# Patient Record
Sex: Female | Born: 1945
Health system: Southern US, Community
[De-identification: ages and names within clinical notes are randomized; demographics above are authoritative.]

## PROBLEM LIST (undated history)

## (undated) DIAGNOSIS — E039 Hypothyroidism, unspecified: Secondary | ICD-10-CM

## (undated) DIAGNOSIS — M8580 Other specified disorders of bone density and structure, unspecified site: Secondary | ICD-10-CM

## (undated) DIAGNOSIS — E785 Hyperlipidemia, unspecified: Secondary | ICD-10-CM

## (undated) DIAGNOSIS — Z5189 Encounter for other specified aftercare: Secondary | ICD-10-CM

## (undated) DIAGNOSIS — R011 Cardiac murmur, unspecified: Secondary | ICD-10-CM

## (undated) DIAGNOSIS — C801 Malignant (primary) neoplasm, unspecified: Secondary | ICD-10-CM

## (undated) DIAGNOSIS — D219 Benign neoplasm of connective and other soft tissue, unspecified: Secondary | ICD-10-CM

## (undated) DIAGNOSIS — M25552 Pain in left hip: Secondary | ICD-10-CM

## (undated) DIAGNOSIS — T7840XA Allergy, unspecified, initial encounter: Secondary | ICD-10-CM

## (undated) DIAGNOSIS — Z8249 Family history of ischemic heart disease and other diseases of the circulatory system: Secondary | ICD-10-CM

## (undated) DIAGNOSIS — K635 Polyp of colon: Secondary | ICD-10-CM

## (undated) DIAGNOSIS — M25551 Pain in right hip: Secondary | ICD-10-CM

## (undated) DIAGNOSIS — I10 Essential (primary) hypertension: Secondary | ICD-10-CM

## (undated) DIAGNOSIS — R0600 Dyspnea, unspecified: Secondary | ICD-10-CM

## (undated) DIAGNOSIS — H269 Unspecified cataract: Secondary | ICD-10-CM

## (undated) HISTORY — DX: Hypothyroidism, unspecified: E03.9

## (undated) HISTORY — DX: Family history of ischemic heart disease and other diseases of the circulatory system: Z82.49

## (undated) HISTORY — DX: Hyperlipidemia, unspecified: E78.5

## (undated) HISTORY — DX: Polyp of colon: K63.5

## (undated) HISTORY — PX: BREAST CYST EXCISION: SHX579

## (undated) HISTORY — DX: Benign neoplasm of connective and other soft tissue, unspecified: D21.9

## (undated) HISTORY — DX: Essential (primary) hypertension: I10

## (undated) HISTORY — DX: Other specified disorders of bone density and structure, unspecified site: M85.80

## (undated) HISTORY — DX: Unspecified cataract: H26.9

## (undated) HISTORY — DX: Allergy, unspecified, initial encounter: T78.40XA

## (undated) HISTORY — PX: COLONOSCOPY: SHX174

## (undated) HISTORY — DX: Malignant (primary) neoplasm, unspecified: C80.1

## (undated) HISTORY — DX: Encounter for other specified aftercare: Z51.89

## (undated) HISTORY — DX: Cardiac murmur, unspecified: R01.1

---
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
## 1988-03-23 HISTORY — PX: TUBAL LIGATION: SHX77

## 1996-03-23 HISTORY — PX: LUNG CANCER SURGERY: SHX702

## 1996-03-23 HISTORY — PX: CHOLECYSTECTOMY: SHX55

## 1999-11-10 ENCOUNTER — Encounter: Admission: RE | Admit: 1999-11-10 | Discharge: 1999-11-10 | Payer: Self-pay | Admitting: Hematology & Oncology

## 1999-11-10 ENCOUNTER — Encounter: Payer: Self-pay | Admitting: Hematology & Oncology

## 2000-11-01 ENCOUNTER — Encounter: Admission: RE | Admit: 2000-11-01 | Discharge: 2000-11-01 | Payer: Self-pay | Admitting: Hematology & Oncology

## 2000-11-01 ENCOUNTER — Encounter: Payer: Self-pay | Admitting: Hematology & Oncology

## 2001-05-16 ENCOUNTER — Ambulatory Visit (HOSPITAL_COMMUNITY): Admission: RE | Admit: 2001-05-16 | Discharge: 2001-05-16 | Payer: Self-pay | Admitting: Hematology & Oncology

## 2001-05-16 ENCOUNTER — Encounter: Payer: Self-pay | Admitting: Hematology & Oncology

## 2002-03-23 DIAGNOSIS — K635 Polyp of colon: Secondary | ICD-10-CM

## 2002-03-23 HISTORY — DX: Polyp of colon: K63.5

## 2003-10-23 ENCOUNTER — Other Ambulatory Visit: Admission: RE | Admit: 2003-10-23 | Discharge: 2003-10-23 | Payer: Self-pay | Admitting: Family Medicine

## 2004-05-02 ENCOUNTER — Ambulatory Visit: Payer: Self-pay | Admitting: Hematology & Oncology

## 2005-05-01 ENCOUNTER — Ambulatory Visit: Payer: Self-pay | Admitting: Hematology & Oncology

## 2005-12-04 ENCOUNTER — Ambulatory Visit: Payer: Self-pay | Admitting: Family Medicine

## 2005-12-11 ENCOUNTER — Other Ambulatory Visit: Admission: RE | Admit: 2005-12-11 | Discharge: 2005-12-11 | Payer: Self-pay | Admitting: Family Medicine

## 2005-12-11 ENCOUNTER — Ambulatory Visit: Payer: Self-pay | Admitting: Family Medicine

## 2006-04-28 ENCOUNTER — Ambulatory Visit: Payer: Self-pay | Admitting: Hematology & Oncology

## 2006-05-03 LAB — CBC WITH DIFFERENTIAL/PLATELET
Eosinophils Absolute: 0.2 10*3/uL (ref 0.0–0.5)
MONO#: 0.7 10*3/uL (ref 0.1–0.9)
NEUT#: 2.9 10*3/uL (ref 1.5–6.5)
RBC: 4.68 10*6/uL (ref 3.70–5.32)
RDW: 12.6 % (ref 11.3–14.5)
WBC: 5.8 10*3/uL (ref 3.9–10.0)

## 2006-05-03 LAB — COMPREHENSIVE METABOLIC PANEL
Albumin: 4.7 g/dL (ref 3.5–5.2)
Alkaline Phosphatase: 69 U/L (ref 39–117)
CO2: 23 mEq/L (ref 19–32)
Glucose, Bld: 101 mg/dL — ABNORMAL HIGH (ref 70–99)
Potassium: 4 mEq/L (ref 3.5–5.3)
Sodium: 143 mEq/L (ref 135–145)
Total Protein: 7.4 g/dL (ref 6.0–8.3)

## 2006-05-11 ENCOUNTER — Ambulatory Visit: Payer: Self-pay | Admitting: Family Medicine

## 2007-01-05 ENCOUNTER — Other Ambulatory Visit: Admission: RE | Admit: 2007-01-05 | Discharge: 2007-01-05 | Payer: Self-pay | Admitting: Family Medicine

## 2007-01-05 ENCOUNTER — Ambulatory Visit: Payer: Self-pay | Admitting: Family Medicine

## 2007-04-29 ENCOUNTER — Ambulatory Visit: Payer: Self-pay | Admitting: Hematology & Oncology

## 2007-05-04 LAB — CBC WITH DIFFERENTIAL/PLATELET
Eosinophils Absolute: 0.1 10*3/uL (ref 0.0–0.5)
LYMPH%: 30.8 % (ref 14.0–48.0)
MONO#: 0.7 10*3/uL (ref 0.1–0.9)
NEUT#: 3.7 10*3/uL (ref 1.5–6.5)
Platelets: 242 10*3/uL (ref 145–400)
RBC: 4.72 10*6/uL (ref 3.70–5.32)
RDW: 13.3 % (ref 11.3–14.5)
WBC: 6.6 10*3/uL (ref 3.9–10.0)

## 2007-05-04 LAB — COMPREHENSIVE METABOLIC PANEL
Albumin: 5 g/dL (ref 3.5–5.2)
CO2: 24 mEq/L (ref 19–32)
Glucose, Bld: 100 mg/dL — ABNORMAL HIGH (ref 70–99)
Potassium: 4.4 mEq/L (ref 3.5–5.3)
Sodium: 140 mEq/L (ref 135–145)
Total Protein: 7.6 g/dL (ref 6.0–8.3)

## 2007-08-29 ENCOUNTER — Ambulatory Visit: Payer: Self-pay | Admitting: Internal Medicine

## 2007-09-15 ENCOUNTER — Ambulatory Visit: Payer: Self-pay | Admitting: Internal Medicine

## 2007-09-15 ENCOUNTER — Encounter: Payer: Self-pay | Admitting: Internal Medicine

## 2007-09-16 ENCOUNTER — Encounter: Payer: Self-pay | Admitting: Internal Medicine

## 2008-01-16 ENCOUNTER — Ambulatory Visit: Payer: Self-pay | Admitting: Family Medicine

## 2008-02-03 ENCOUNTER — Ambulatory Visit: Payer: Self-pay | Admitting: Family Medicine

## 2008-02-09 LAB — HM DEXA SCAN

## 2008-03-23 HISTORY — PX: PERICARDIAL WINDOW: SHX2213

## 2008-04-10 ENCOUNTER — Ambulatory Visit: Payer: Self-pay | Admitting: Family Medicine

## 2008-06-07 ENCOUNTER — Ambulatory Visit: Payer: Self-pay | Admitting: Family Medicine

## 2008-09-07 ENCOUNTER — Ambulatory Visit: Payer: Self-pay | Admitting: Family Medicine

## 2008-11-05 ENCOUNTER — Encounter: Admission: RE | Admit: 2008-11-05 | Discharge: 2008-11-05 | Payer: Self-pay | Admitting: Family Medicine

## 2008-11-05 ENCOUNTER — Ambulatory Visit: Payer: Self-pay | Admitting: Family Medicine

## 2008-11-12 ENCOUNTER — Ambulatory Visit: Payer: Self-pay | Admitting: Thoracic Surgery

## 2008-11-19 ENCOUNTER — Inpatient Hospital Stay (HOSPITAL_COMMUNITY): Admission: RE | Admit: 2008-11-19 | Discharge: 2008-11-23 | Payer: Self-pay | Admitting: Thoracic Surgery

## 2008-11-19 ENCOUNTER — Encounter: Payer: Self-pay | Admitting: Thoracic Surgery

## 2008-11-19 ENCOUNTER — Ambulatory Visit: Payer: Self-pay | Admitting: Thoracic Surgery

## 2008-11-28 ENCOUNTER — Encounter: Admission: RE | Admit: 2008-11-28 | Discharge: 2008-11-28 | Payer: Self-pay | Admitting: Thoracic Surgery

## 2008-11-28 ENCOUNTER — Ambulatory Visit: Payer: Self-pay | Admitting: Thoracic Surgery

## 2008-12-19 ENCOUNTER — Ambulatory Visit: Payer: Self-pay | Admitting: Thoracic Surgery

## 2009-01-24 ENCOUNTER — Ambulatory Visit: Payer: Self-pay | Admitting: Family Medicine

## 2009-01-30 ENCOUNTER — Ambulatory Visit: Payer: Self-pay | Admitting: Thoracic Surgery

## 2009-01-30 ENCOUNTER — Encounter: Admission: RE | Admit: 2009-01-30 | Discharge: 2009-01-30 | Payer: Self-pay | Admitting: Thoracic Surgery

## 2010-02-18 ENCOUNTER — Ambulatory Visit: Payer: Self-pay | Admitting: Family Medicine

## 2010-05-16 IMAGING — CR DG CHEST 1V PORT
1 series · 1 of 1 positions shown · non-contrast
Comparison: 1 day prior

CLINICAL DATA: Pericardial effusion.  Status post surgery.  Chest
tube.

PORTABLE CHEST - 1 VIEW

[view not recorded]
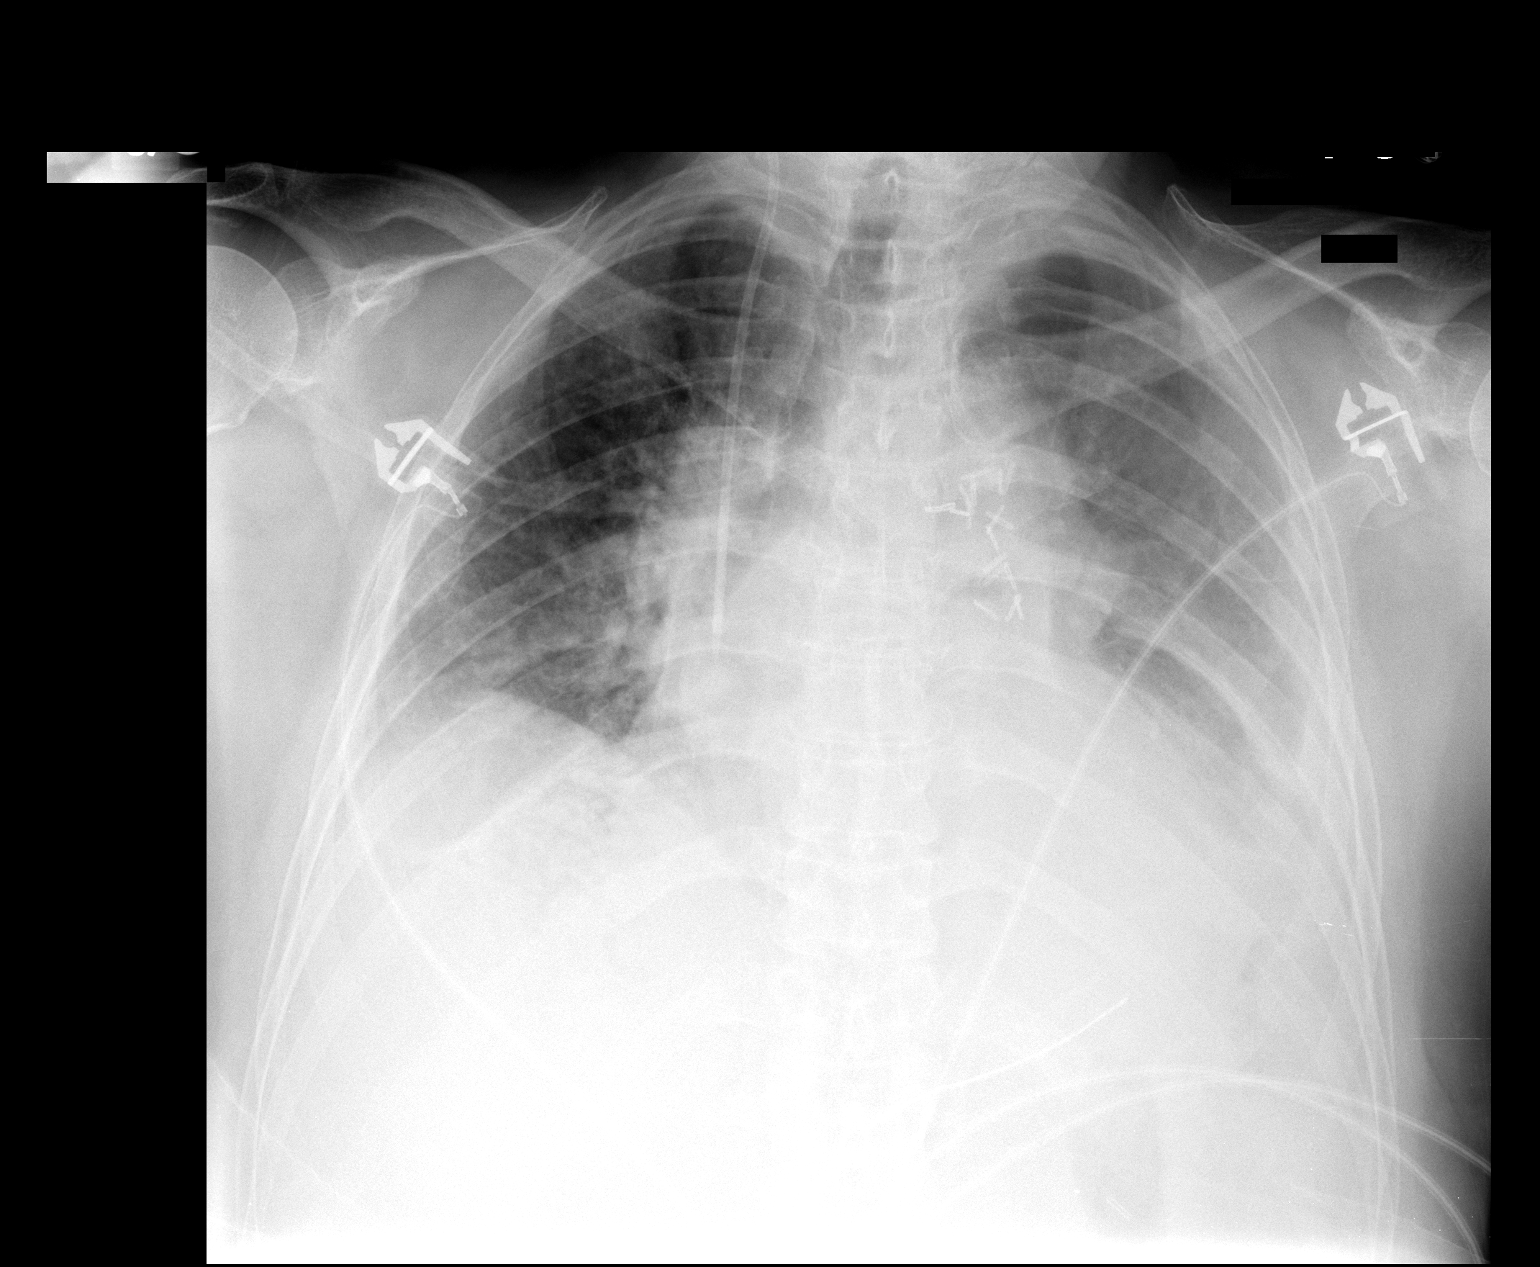

[1 of 1 positions shown; findings below may reference images not displayed]

FINDINGS: A right IJ central line terminates at the low SVC.
Inferior left chest tube.  Patient minimally rotated to the right.
Cardiomegaly accentuated by AP portable technique.  Small left
pleural effusion is unchanged. No pneumothorax.  Decreased lung
volumes.  Increase in moderate interstitial edema.  Development of
mild right base atelectasis.  Slight increase in airspace disease
throughout the inferior left hemithorax.
IMPRESSION: 1.  Worsened aeration with decreased lung volumes, increased edema,
and worsening bibasilar airspace disease.
2.  Similar small left pleural effusion.
3. No pneumothorax.

## 2010-05-18 IMAGING — CR DG CHEST 2V
2 series · 2 of 2 positions shown · non-contrast
Comparison: [DATE]

CLINICAL DATA: Pericardial pleural effusions, status post surgery

CHEST - 2 VIEW

[w chest pa]
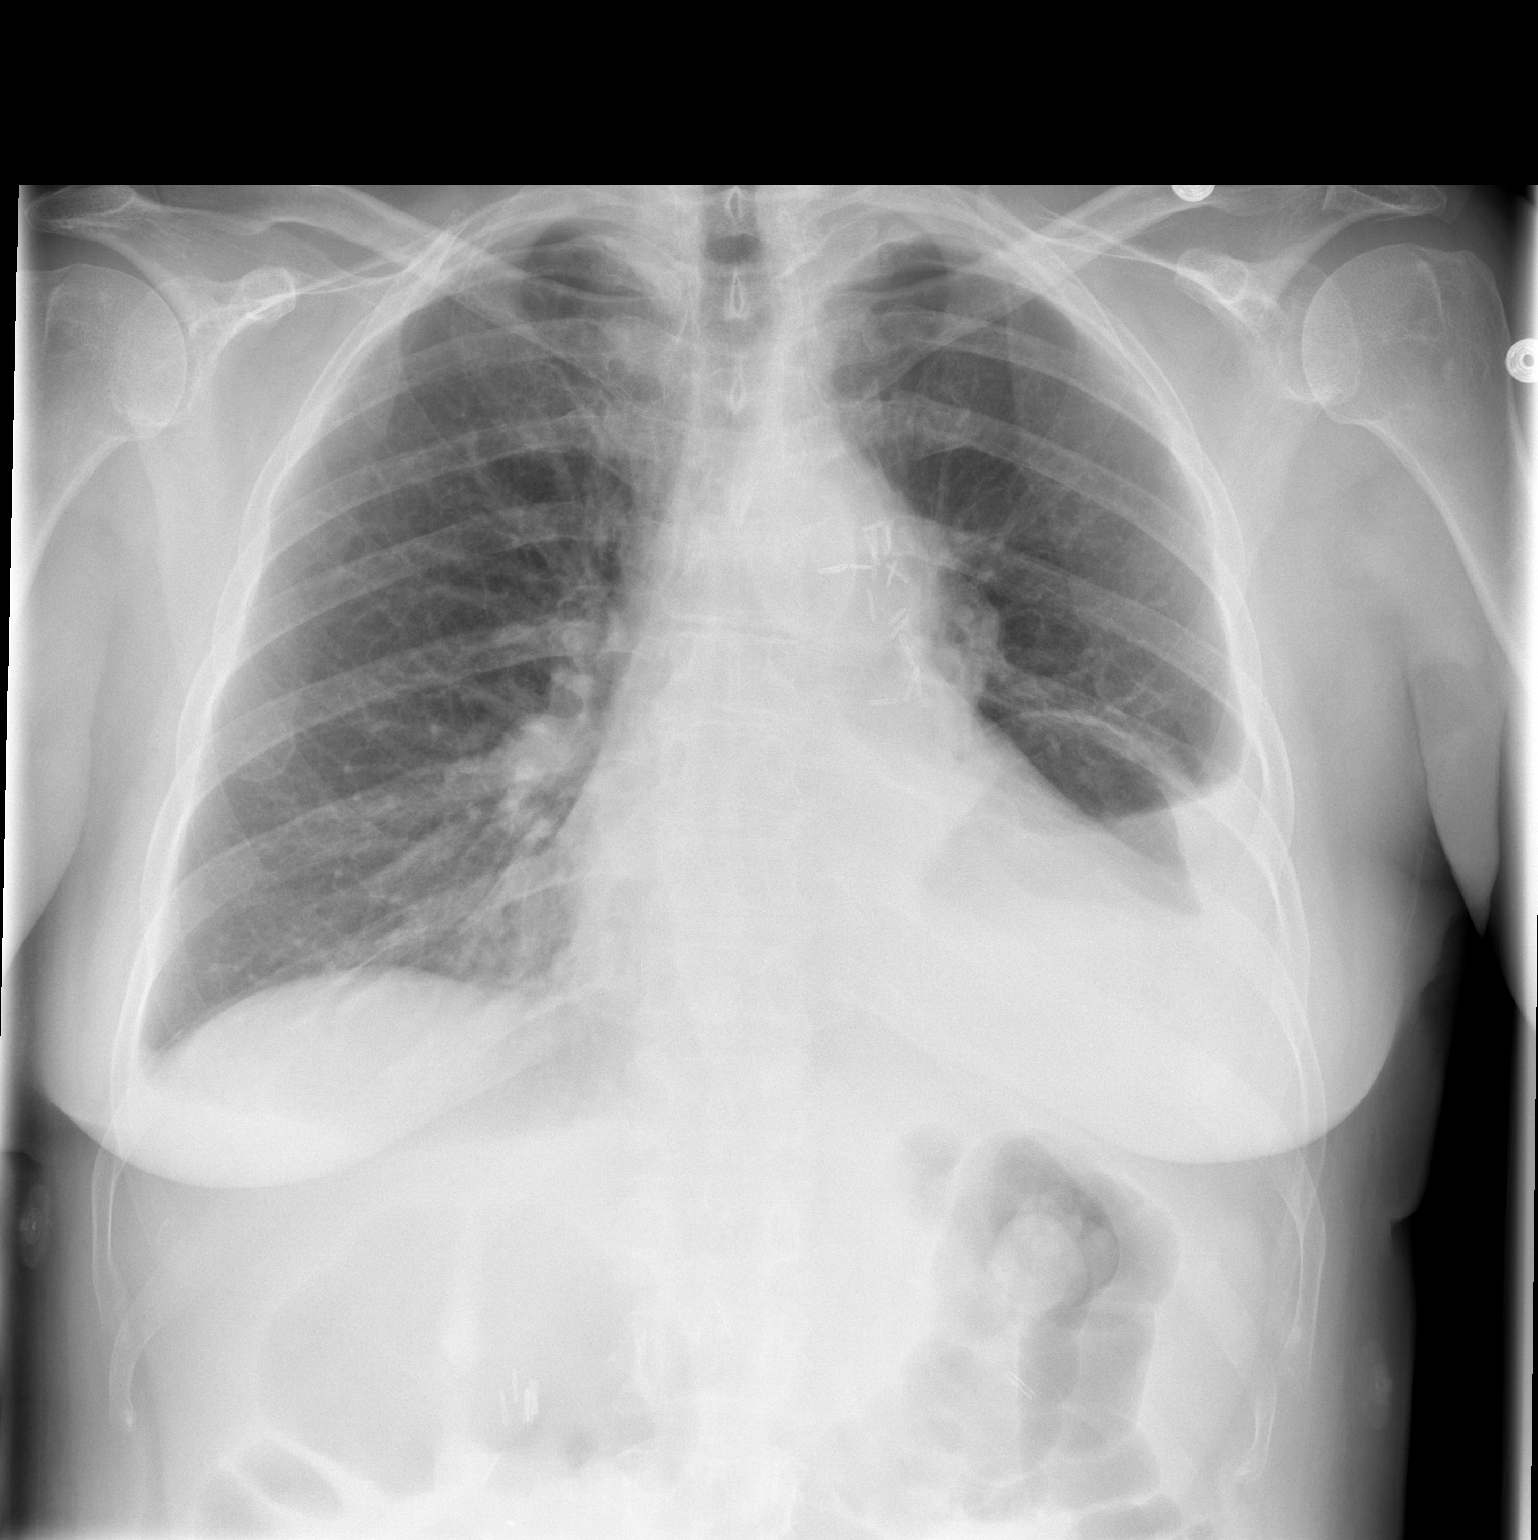

[w chest lat]
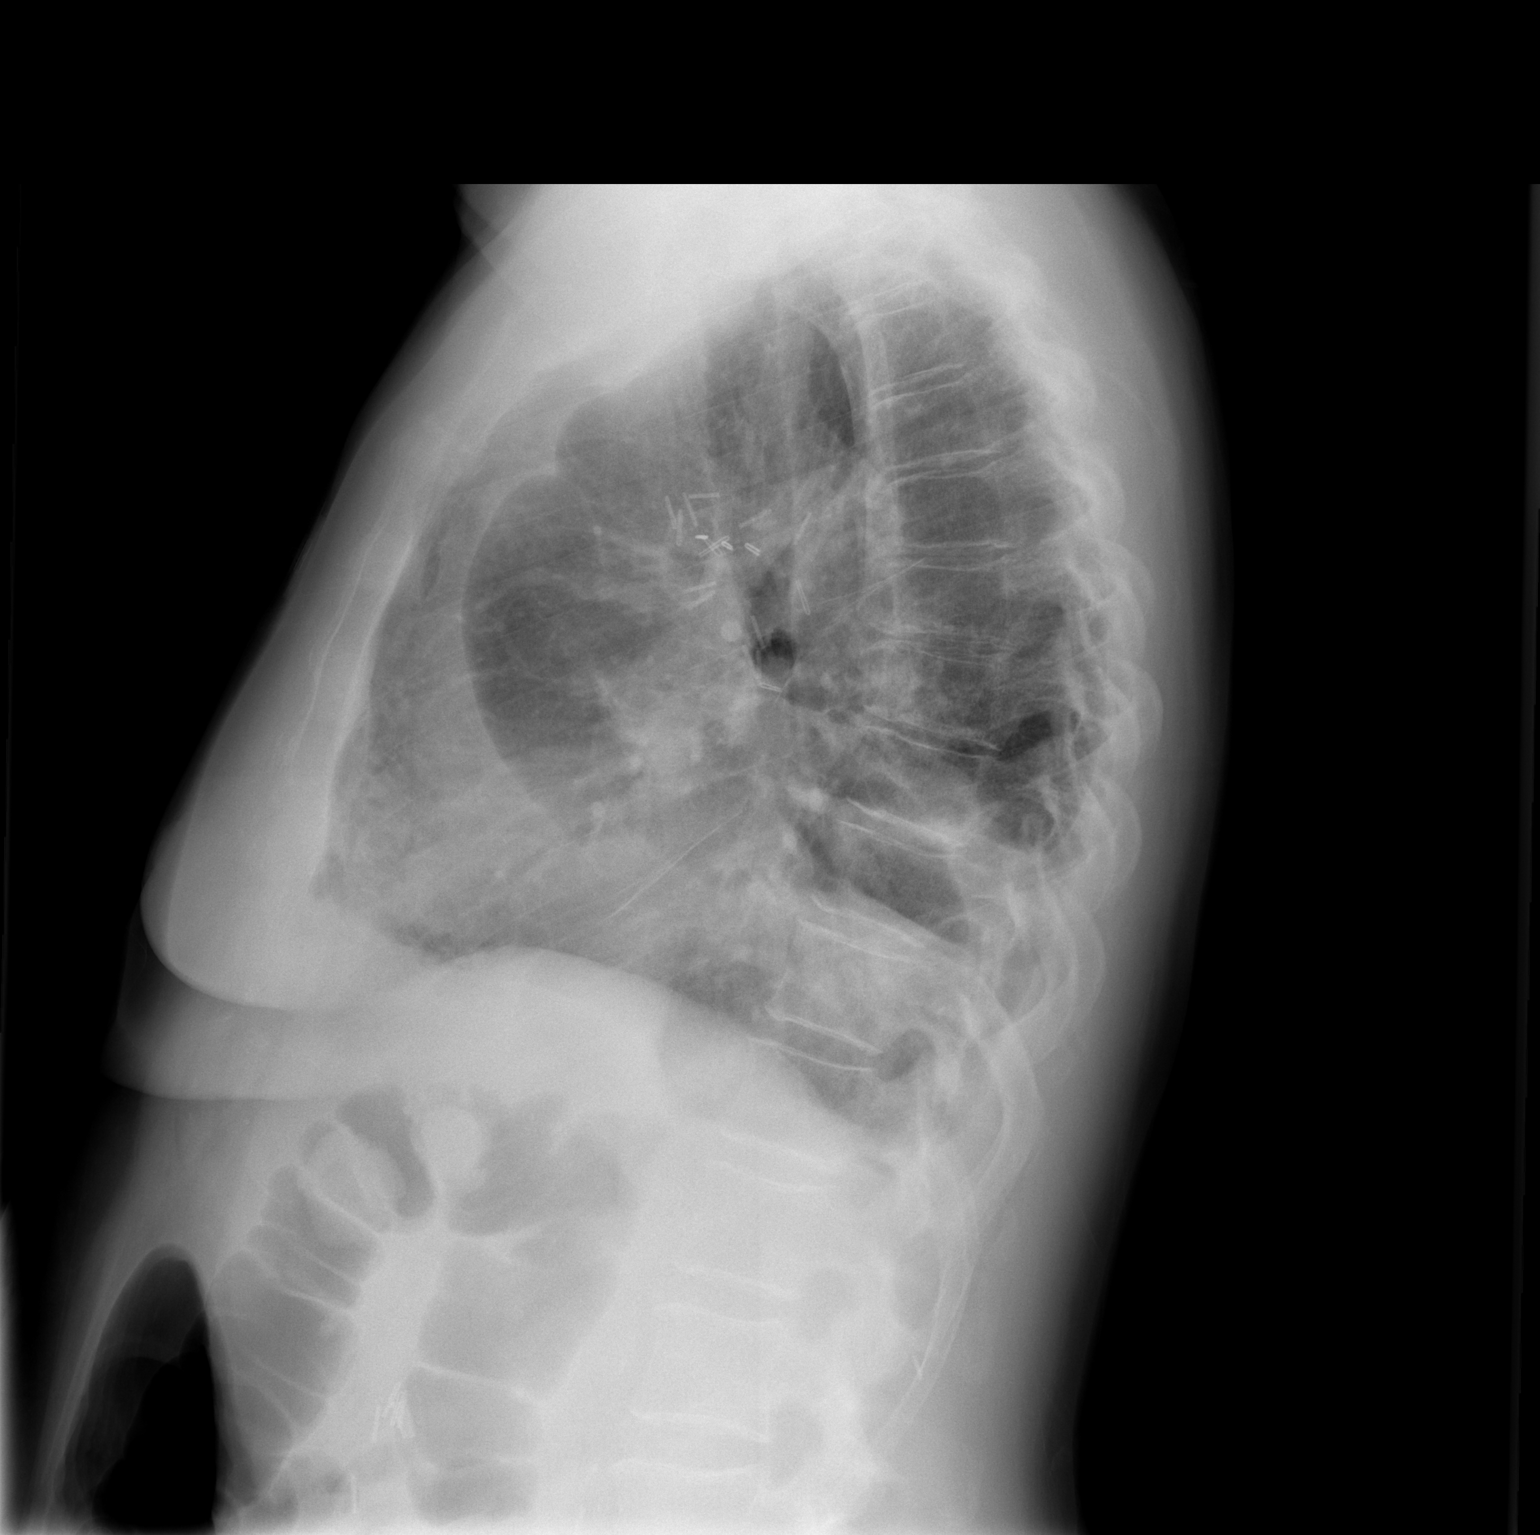

[2 of 2 positions shown; findings below may reference images not displayed]

FINDINGS: Cardiac enlargement.
Stable mediastinal contours and pulmonary vascularity.
Surgical clips at lung, mild, question left upper lobectomy.
Left pleural effusion and basilar atelectasis.
Volume loss left chest with elevation left diaphragm.
Minimal right pleural effusion and basilar atelectasis.
Small amounts of anterior mediastinal gas/air on lateral view,
likely due to surgery.
No pneumothorax.
IMPRESSION: Stable postsurgical changes as above.

## 2010-05-23 IMAGING — CR DG CHEST 2V
2 series · 2 of 2 positions shown · non-contrast
Comparison: 11/23/2008 and CT chest 11/05/2008

CLINICAL DATA: History of pericardial effusion.  Postop.  Shortness
of breath.

CHEST - 2 VIEW

[w chest pa]
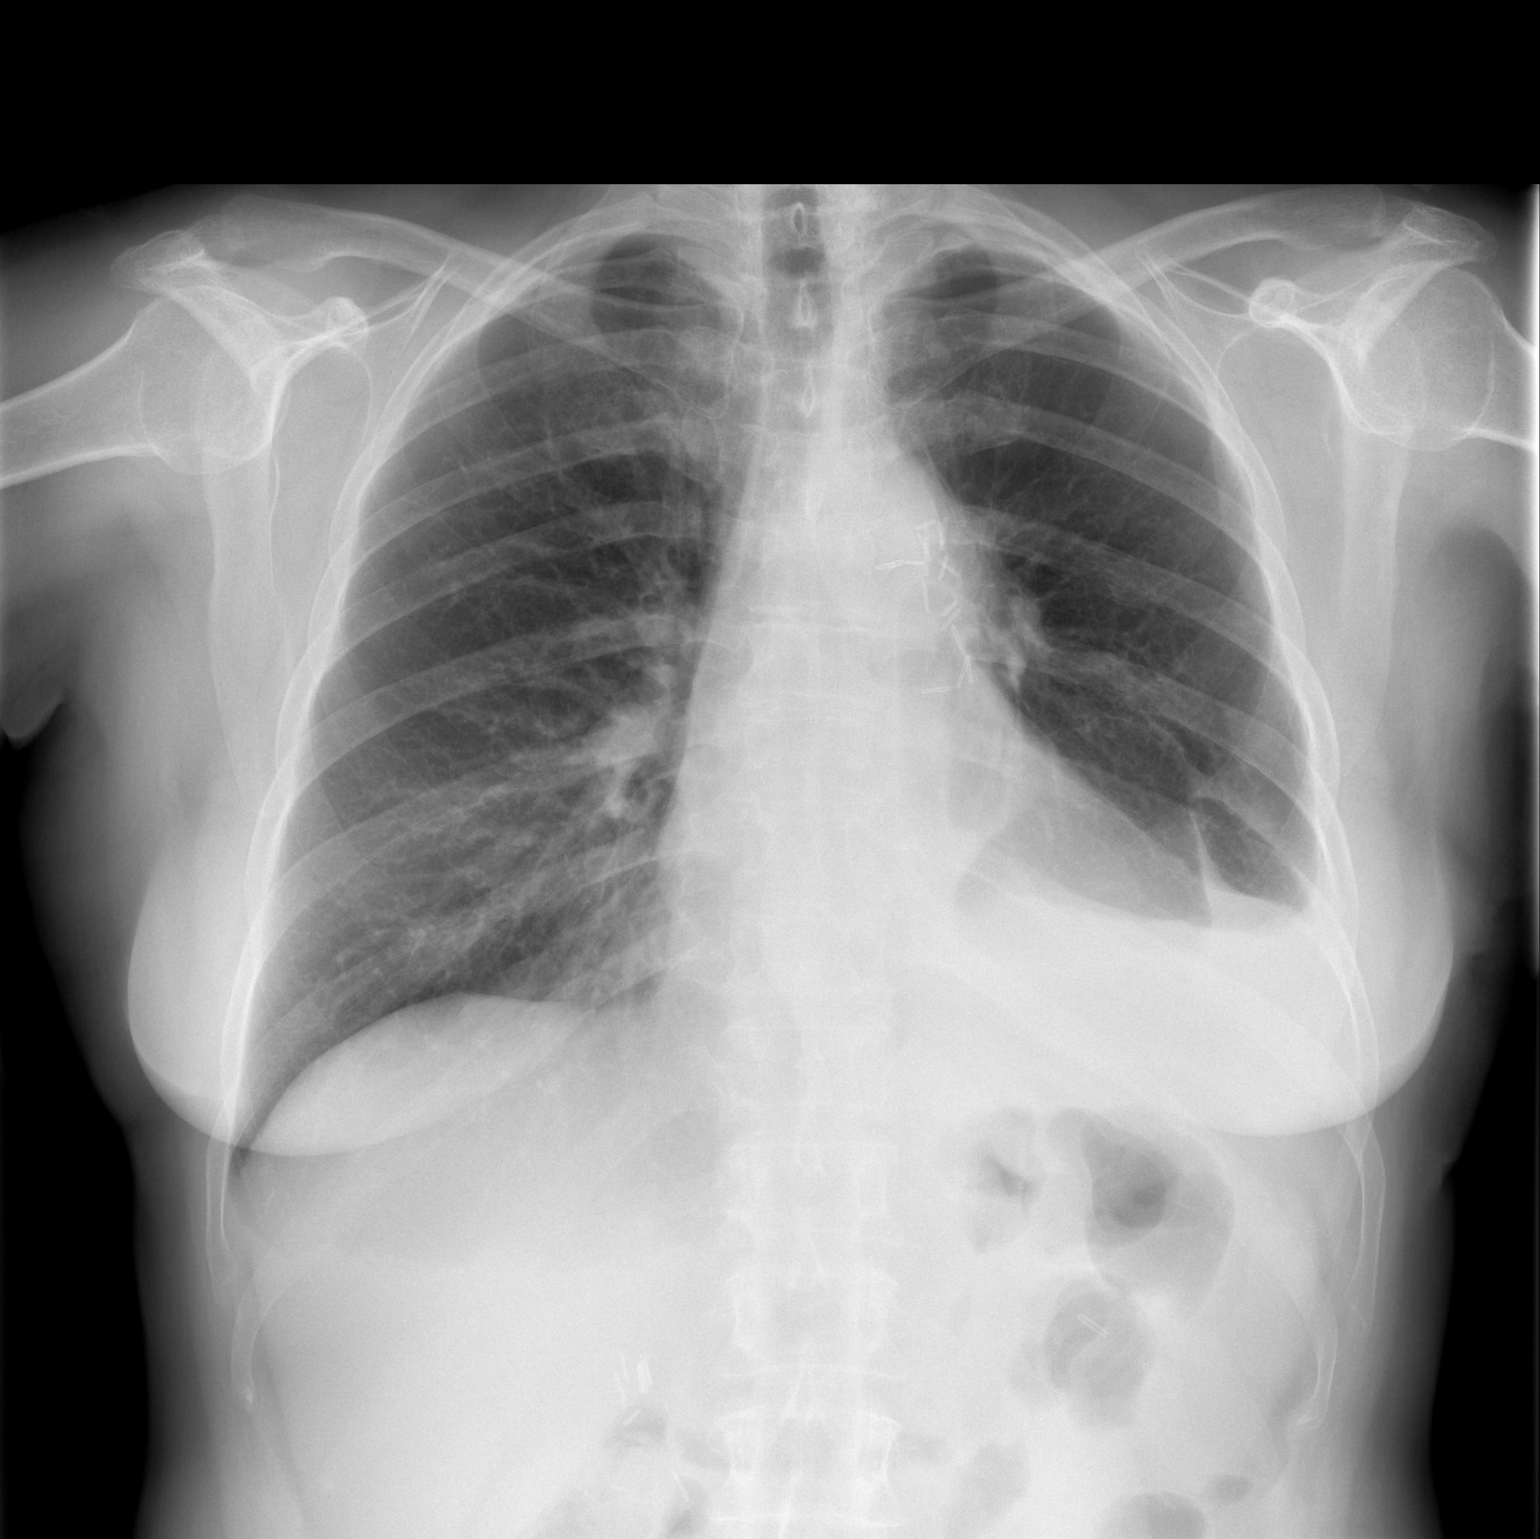

[w chest lat]
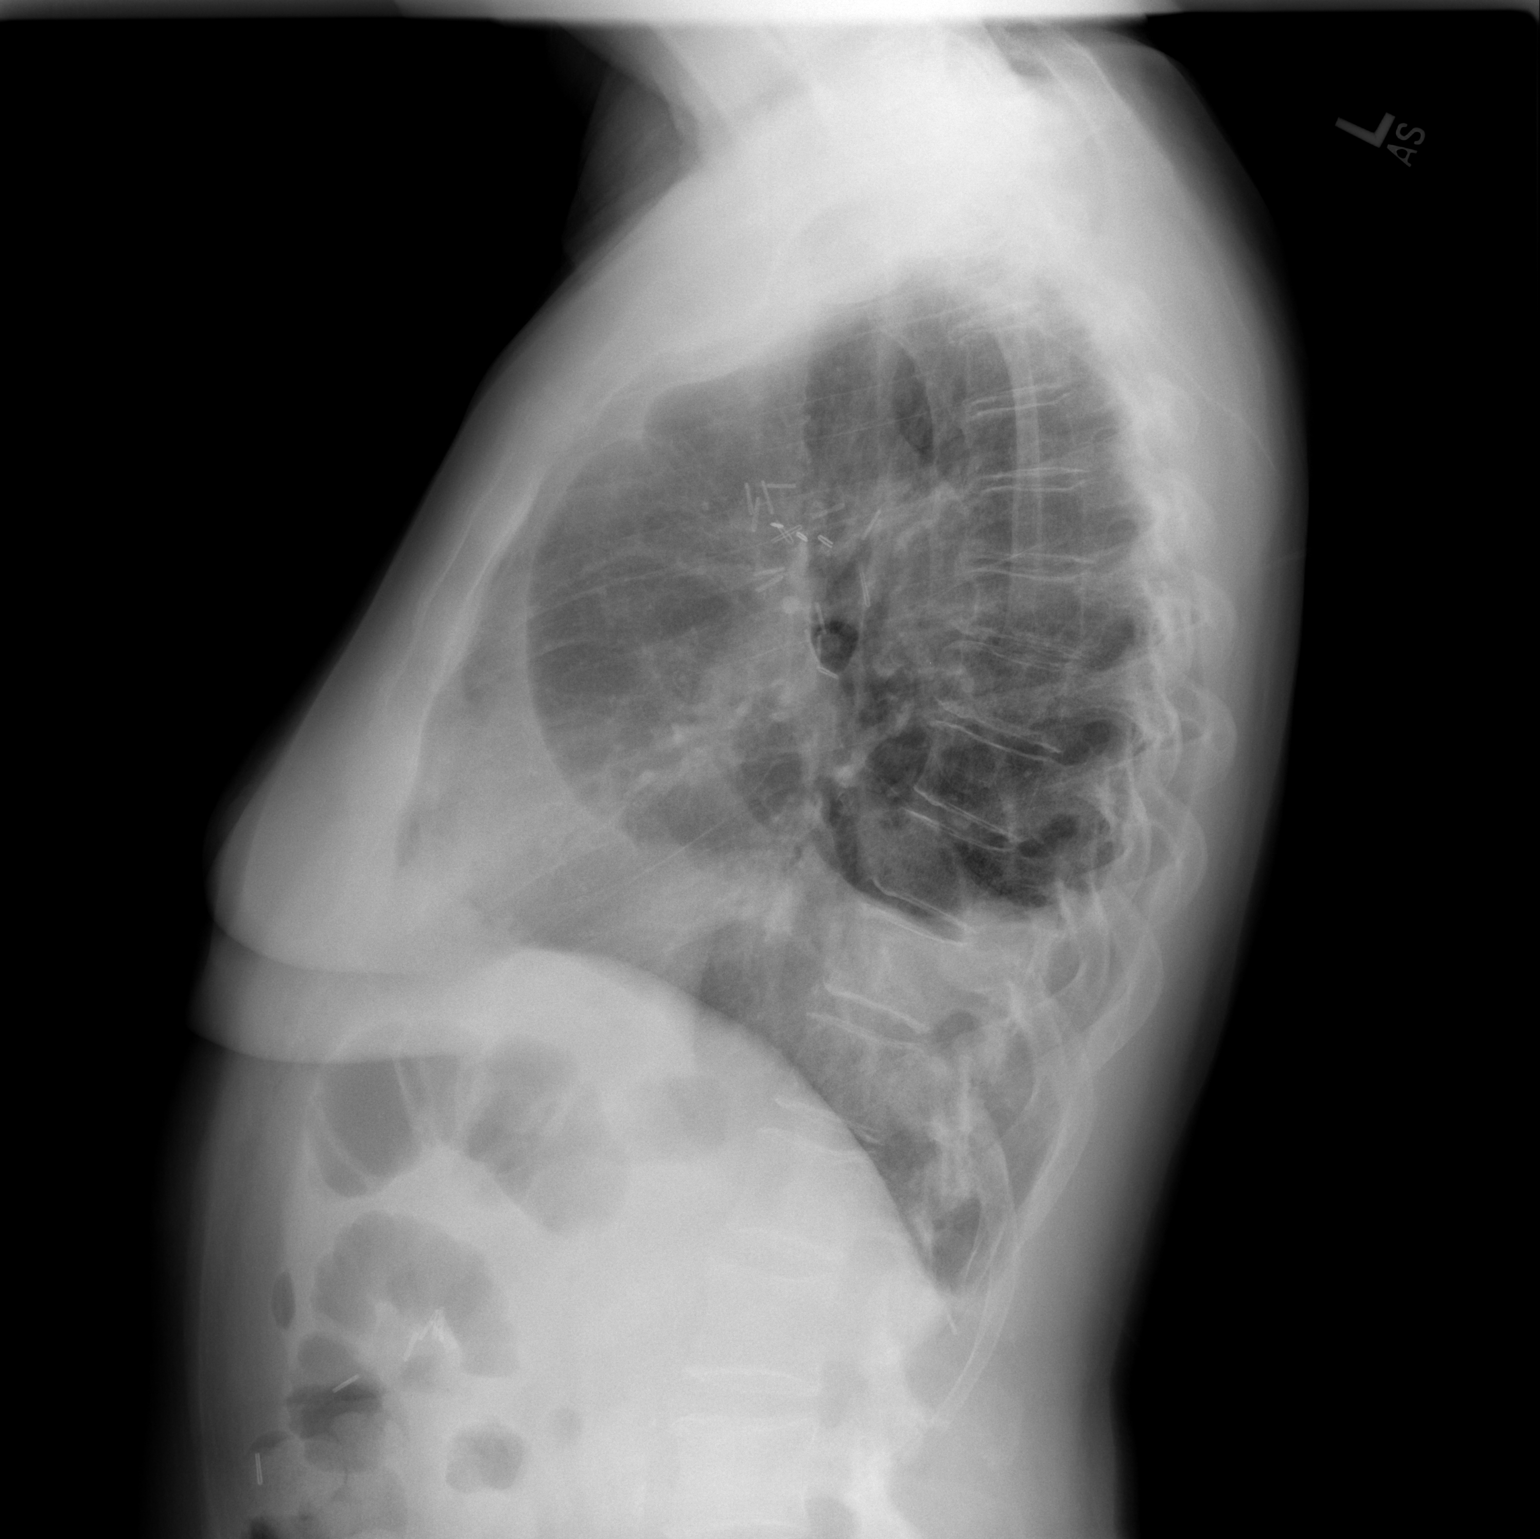

[2 of 2 positions shown; findings below may reference images not displayed]

FINDINGS: Trachea is midline.  Cardiopericardial silhouette is
grossly stable.  There are postoperative changes in the left
perihilar region.  Small left pleural effusion is stable, with
associated compressive air space disease in the left lung base.
Trace right pleural effusion.
IMPRESSION: 1.  Stable left pleural effusion and left basilar compressive
atelectasis.
2.  Trace right pleural effusion.

## 2010-06-27 LAB — COMPREHENSIVE METABOLIC PANEL
AST: 13 U/L (ref 0–37)
Albumin: 2.8 g/dL — ABNORMAL LOW (ref 3.5–5.2)
Alkaline Phosphatase: 87 U/L (ref 39–117)
Creatinine, Ser: 0.75 mg/dL (ref 0.4–1.2)
GFR calc Af Amer: >60 mL/min (ref >60)
Glucose, Bld: 115 mg/dL — ABNORMAL HIGH (ref 70–99)
Sodium: 136 mEq/L (ref 135–145)
Total Bilirubin: 0.9 mg/dL (ref 0.3–1.2)

## 2010-06-27 LAB — CBC
Hemoglobin: 11.2 g/dL — ABNORMAL LOW (ref 12.0–15.0)
MCHC: 33.8 g/dL (ref 30.0–36.0)
MCHC: 33.8 g/dL (ref 30.0–36.0)
MCV: 92.2 fL (ref 78.0–100.0)
RBC: 3.43 MIL/uL — ABNORMAL LOW (ref 3.87–5.11)
RDW: 13.4 % (ref 11.5–15.5)
RDW: 13.6 % (ref 11.5–15.5)

## 2010-06-27 LAB — TSH: TSH: 7.993 u[IU]/mL — ABNORMAL HIGH (ref 0.350–4.500)

## 2010-06-28 LAB — URINALYSIS, ROUTINE W REFLEX MICROSCOPIC
Bilirubin Urine: NEGATIVE
Glucose, UA: NEGATIVE mg/dL
Hgb urine dipstick: NEGATIVE
Ketones, ur: NEGATIVE mg/dL
Protein, ur: NEGATIVE mg/dL

## 2010-06-28 LAB — POCT I-STAT 3, ART BLOOD GAS (G3+)
Bicarbonate: 27.3 mEq/L — ABNORMAL HIGH (ref 20.0–24.0)
pCO2 arterial: 40.2 mmHg (ref 35.0–45.0)
pH, Arterial: 7.441 — ABNORMAL HIGH (ref 7.350–7.400)
pO2, Arterial: 60 mmHg — ABNORMAL LOW (ref 80.0–100.0)

## 2010-06-28 LAB — BASIC METABOLIC PANEL
Chloride: 103 mEq/L (ref 96–112)
GFR calc Af Amer: >60 mL/min (ref >60)
GFR calc non Af Amer: >60 mL/min (ref >60)
Potassium: 3.9 mEq/L (ref 3.5–5.1)
Sodium: 137 mEq/L (ref 135–145)

## 2010-06-28 LAB — BLOOD GAS, ARTERIAL
Acid-Base Excess: 0.2 mmol/L (ref 0.0–2.0)
Bicarbonate: 23.3 mEq/L (ref 20.0–24.0)
O2 Saturation: 96.5 %
pO2, Arterial: 80.2 mmHg (ref 80.0–100.0)

## 2010-06-28 LAB — CBC
HCT: 33.8 % — ABNORMAL LOW (ref 36.0–46.0)
Hemoglobin: 13.1 g/dL (ref 12.0–15.0)
MCHC: 33.7 g/dL (ref 30.0–36.0)
MCV: 91.7 fL (ref 78.0–100.0)
MCV: 92.1 fL (ref 78.0–100.0)
RBC: 3.67 MIL/uL — ABNORMAL LOW (ref 3.87–5.11)
RBC: 4.22 MIL/uL (ref 3.87–5.11)
WBC: 10.3 10*3/uL (ref 4.0–10.5)

## 2010-06-28 LAB — TYPE AND SCREEN: ABO/RH(D): O POS

## 2010-06-28 LAB — COMPREHENSIVE METABOLIC PANEL
ALT: 30 U/L (ref 0–35)
CO2: 20 mEq/L (ref 19–32)
Calcium: 9.4 mg/dL (ref 8.4–10.5)
GFR calc non Af Amer: >60 mL/min (ref >60)
Glucose, Bld: 107 mg/dL — ABNORMAL HIGH (ref 70–99)
Sodium: 139 mEq/L (ref 135–145)
Total Bilirubin: 0.8 mg/dL (ref 0.3–1.2)

## 2010-06-28 LAB — PROTIME-INR: Prothrombin Time: 14.2 seconds (ref 11.6–15.2)

## 2010-06-28 LAB — MRSA PCR SCREENING: MRSA by PCR: NEGATIVE

## 2010-08-05 NOTE — Letter (Signed)
November 28, 2008   Thereasa Solo. Little, M.D.  [address]  [address]  [place], [place] [address]   Re:  SHEMICA, MEATH               DOB:  05/23/1945   Dear Gaspar Garbe,   I saw the patient today and she is feeling much better after her  pericardial window.  She says she is breathing better.  Her blood  pressure was 147/85, pulse 100, respirations 20, sats were 98%.  Lungs  were clear to auscultation and percussion.  Chest x-ray shows a small  left pleural effusion which is stable.  Overall, she is doing remarkably  well.  Path finding showed acute and chronic inflammation.  I will see  her back again in 3 weeks with a chest x-ray.   Sincerely,   Ines Bloomer, M.D.  Electronically Signed   DPB/MEDQ  D:  11/28/2008  T:  11/28/2008  Job:  [id number]

## 2010-08-05 NOTE — Assessment & Plan Note (Signed)
OFFICE VISIT   DALAL, LIVENGOOD  DOB:  09-13-1945                                        January 30, 2009  CHART #:  [id number]   Her blood pressure was 162/94, pulse 66, respirations 18, sats were 98%.  Lungs were clear to auscultation and percussion.  Heart, regular sinus  rhythm.  No murmur.  Incisions were well healed.  Chest x-ray is stable  and shows normal postoperative changes.  She is doing well overall and  we will refer her back to her managing doctor.  She has no more  effusions.   Ines Bloomer, M.D.  Electronically Signed   DPB/MEDQ  D:  01/30/2009  T:  01/31/2009  Job:  [id number]

## 2010-08-05 NOTE — H&P (Signed)
Tina Owen, Tina Owen NO.:  [id number]   MEDICAL RECORD NO.:  [id number]          PATIENT TYPE:  INP   LOCATION:  NA                           FACILITY:  [facility]   PHYSICIAN:  Ines Bloomer, M.D. DATE OF BIRTH:  [birth year]   DATE OF ADMISSION:  DATE OF DISCHARGE:                              HISTORY & PHYSICAL   HISTORY OF CHIEF COMPLAINT:  Dyspnea.   HISTORY OF PRESENT ILLNESS:  A 65 year old patient has had a low-grade  temperature today, and some shortness of breath in the past 2-3 weeks  and became more short of breath.  She did well  in the tanner  but not  recently.  Only a short walk or very little incline will cause her  shortness of breath.  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 Myna Hidalgo.  She spends the majority  in the  weekend tired in bed without energy.  The chest x-ray was done and a CT  scan showed a large left pericardial effusion with some stranding and a  small pericardial effusion.  A 2-D echo showed pericardial effusion  having strand and thickness to the pericardium with no evidence of  tamponade.  She is admitted for subxiphoid pericardial window.   PAST MEDICAL HISTORY:  She has a history of thyroid disease, thyroid  cyst, hypertension, hyperlipidemia.  She had a laparoscopic  cholecystectomy in 1998, when she had her upper lobectomy.  The  cholecystectomy was done by Dr. Orpah Greek.  She has had 2 cesarean  sections.   She is allergic to certain types of tape.   MEDICATIONS:  1. Lipitor 80 mg a day.  2. Adalat 30 mg a day.  3. Synthroid 0.1 mg a day.   FAMILY HISTORY:  Noncontributory.   SOCIAL HISTORY:  She is married, has 2 children.  Quit smoking in 1997.  Occasionally, uses alcohol.   REVIEW OF SYSTEMS:  CARDIAC:  She has some tightness and chest pain and  had a cardiac murmur.  No atrial fibrillation.  PULMONARY:   She has some  occasional wheezing.  GI:  Constipation.  GU:  No kidney disease,  dysuria, or frequent urination.  VASCULAR:  No claudication, DVT, or  TIAs.  NEUROLOGICAL:  No dizziness, headaches, blackouts, or seizures.  MUSCULOSKELETAL:  No arthritis or joint pain.  PSYCHIATRIC:  No  psychiatric illness.  EYES, ENT:  No change in eyesight or hearing.  HEMATOLOGICAL:  No problems with bleeding or clotting disorders or  anemia.   PHYSICAL EXAMINATION:  GENERAL:  She is a well-developed, Caucasian  female in no acute distress.  VITAL SIGNS:  Blood pressure is 140/80, pulse 100, respirations 18, and  sats were 94%.  HEAD:  Atraumatic.  Eyes:  Pupils equal and reactive to light and  accommodation.  Extraocular movements are normal.  Ears:  Tympanic  membranes are intact.  Nose:  There is no septal deviation.  Throat  without lesions.  NECK:  Supple.  There  is no jugular venous distention.  CHEST:  Clear to auscultation and percussion.  HEART:  Regular sinus rhythm with some distant heart sounds.  No murmur  or rub was heard.  ABDOMEN:  Soft.  There is no hepatosplenomegaly.  EXTREMITIES:  Pulses are 2+.  There was no clubbing or edema.  NEUROLOGIC:  She is oriented x3.  Sensory and motor intact.  Cranial  nerves intact.  SKIN:  Without lesion.   IMPRESSION:  1. Pericardial effusion, rule out recurrent cancer or rule out      pericarditis.  2. Hypothyroidism.  3. History of stage IIA non-small cell lung cancer with radiation      chemotherapy.  4. Hypertension.   PLAN:  Subxiphoid pericardial window.      Ines Bloomer, M.D.  Electronically Signed     DPB/MEDQ  D:  11/15/2008  T:  11/16/2008  Job:  [id number]

## 2010-08-05 NOTE — Letter (Signed)
November 12, 2008   Thereasa Solo. Little, MD  [address]  [place], [place]  [address]   Re:  Tina Owen, Tina Owen               DOB:  02/01/1946   Dear Mindi Junker:   I appreciate the opportunity of seeing the patient.  This 65 year old  patient has had a low-grade temp with some aches and increasing  shortness of breath.  She said, 2-3 weeks ago she did well considerably  without getting short of breath particularly on vacation in [place]  while now just a short walk caused shortness of breath.  She had a  previous left upper lobectomy in 1998 for a bronchioalveolar cancer  which had a N1 node that was positive and she was stage IIA, was treated  with chemotherapy and radiation by Dr. Arlan Organ.  She spends the  majority of her weekend tired in bed and without any energy.  She had a  chest x-ray and then a CT scan that showed a large pericardial effusion  with small left pleural effusion.  A 2-D echo, which showed the  pericardial effusion with having the tamponades.  There was some  thickness of the pericardium and some questionable stranding in the  pericardium.   PAST MEDICAL HISTORY:  She has had thyroid disease with thyroid cyst,  hypertension, and hyperlipidemia.  She has had a previous  cholecystectomy in 1998 as well as the left upper lobectomy, 2 cesarean  sections.   MEDICATIONS:  1. Lipitor 80 mg a day.  2. Adalat 30 mg a day.  3. Synthroid 0.1 mg daily.   ALLERGIES:  She is allergic to certain types of tape.   FAMILY HISTORY:  Noncontributory.   SOCIAL HISTORY:  She is married, 2 children, smoked until 1997.  She  uses alcohol occasionally.   REVIEW OF SYSTEMS:  CARDIAC:  She has some chest tightness and chest  pain, and a history of cardiac murmur, no atrial fibrillation.  PULMONARY:  She had some wheezing occasionally.  GI:  Constipation.  GU:  No kidney disease, dysuria, or frequent urination.  VASCULAR:  No claudication, DVT, or TIAs.  NEUROLOGIC:  No dizziness,  headaches, blackouts, or seizures.  MUSCULOSKELETAL:  No arthritis or joint pain.  PSYCHIATRIC:  No psychiatric illnesses.  EYE/ENT:  No changes in her eyesight or hearing.  HEMATOLOGIC:  No problems with bleeding, clotting disorders, or anemia.   PHYSICAL EXAMINATION:  Vital Signs:  Her temp is 99, blood pressure is  140/87, pulse 100, respirations 18, and sats were 94%.  Head, Eyes,  Ears, Nose, and Throat:  Unremarkable.  Neck:  Supple without  thyromegaly.  There is no supraclavicular or axillary adenopathy.  Chest:  Clear to auscultation and percussion.  Heart:  Regular sinus  rhythm.  No murmurs.  Abdomen:  Soft.  No hepatosplenomegaly.  Extremities:  Pulses are 2+.  There is no clubbing or edema.  Skin:  On  the left chest, there is left thoracotomy incision.   I discussed the situation with her and feel that a subxiphoid  pericardial incision would be the best approach.  This left lower  effusion I think would not be needed to drain, although, we could put a  chest tube in at the same time of the surgery.  I plan to do this on the  30th, we will do a subxiphoid window with preparation for a median  sternotomy, should there be a lot of  inflammatory tissue that seems to  me could be removed by an open pericardial window.  I discussed this in  detail with her, and I appreciate the opportunity of seeing the patient.  I will let you know when she is admitted.   Ines Bloomer, M.D.  Electronically Signed   DPB/MEDQ  D:  11/12/2008  T:  11/13/2008  Job:  [id number]   cc:   Sharlot Gowda, M.D.  Thereasa Solo. Little, M.D.

## 2010-08-05 NOTE — Assessment & Plan Note (Signed)
OFFICE VISIT   BRAILEY, BUESCHER  DOB:  Oct 26, 1945                                        December 19, 2008  CHART #:  [id number]   I saw the patient back in today.  Her lungs are clear to auscultation  and percussion.  Heart, regular sinus rhythm.  No rub.  Her blood  pressure is 137/85, pulse 100, respirations 18, and sats were 96%.  I  did not hear much of wheezing.  I did not get a chest x-ray today but  will get one in the next time we see her to follow up for her left  pleural effusion where we did her left lobectomy many years ago.  She is  doing well overall and has returned to work and feeling much better.  She will be seeing me this week.   I appreciate the opportunity of seeing the patient.   Ines Bloomer, M.D.  Electronically Signed   DPB/MEDQ  D:  12/19/2008  T:  12/20/2008  Job:  [id number]

## 2010-08-05 NOTE — Discharge Summary (Signed)
Tina Owen, AABERG NO.:  [id number]   MEDICAL RECORD NO.:  [id number]          PATIENT TYPE:  INP   LOCATION:  3311                         FACILITY:  [facility]   PHYSICIAN:  Ines Bloomer, M.D. DATE OF BIRTH:  [birth year]   DATE OF ADMISSION:  11/19/2008  DATE OF DISCHARGE:                               DISCHARGE SUMMARY   HISTORY:  The patient is a 65 year old female who was referred to Dr.  Edwyna Shell for evaluation of a pericardial effusion.  She has a history of  non-small cell carcinoma, having been treated in the past with a left  upper lobectomy as well as chemotherapy and radiation.  Recently, she  has noted increasing dyspnea as well as fatigue.  She had multiple  studies including CT scan and 2-D echocardiogram which revealed the  pericardial effusion and no evidence of tamponade.  Dr. Edwyna Shell felt this  warranted surgical correction and she was admitted this hospitalization  for a subxiphoid pericardial window.   Past medical history includes lung cancer as described above.  She also  has a history of thyroid disease with a thyroid cyst, hypertension,  hyperlipidemia, previous laparoscopic cholecystectomy, and cesarean  section x2.   ALLERGIES:  She is allergic to certain types of TAPE, not listed in the  history.   Home medications included the following:  1. Lipitor 80 mg daily.  2. Adalat 30 mg daily.  3. Synthroid 0.1 mg daily.  4. Vitamin D 2000 mg daily.  5. Aspirin 81 mg daily.  6. Calcium 1 at bedtime.  7. Vitamins at bedtime.   FAMILY HISTORY:  Noncontributory.   SOCIAL HISTORY:  She quit smoking in 1977, occasionally uses alcohol.   REVIEW OF SYSTEMS AND PHYSICAL EXAMINATION:  Please see Dr. Scheryl Darter  history and physical done at the time of admission.   HOSPITAL COURSE:  The patient was admitted electively on November 19, 2008, and underwent the following procedure, subxiphoid pericardial  window.  Findings during the procedure were  notable for scant  pericardial fluid and blood evidence of chronic pericarditis.  She  tolerated the procedure well and was taken to the postanesthesia care  unit in stable condition.   POSTOPERATIVE HOSPITAL COURSE:  The patient has progressed nicely.  She  has had her tube removed.  Her oxygen is being weaned and is currently  having good saturations on 2 L.  Her laboratory values show hemoglobin  and hematocrit of 11 and 33 respectively on November 21, 2008.  Electrolytes, BUN and creatinine are within normal limits.  White blood  cell count is 11.4.  Pathology is pending.  Her overall status is felt  to be tentatively stable for discharge in the morning of November 22, 2008, pending morning round reevaluation.   INSTRUCTIONS:  The patient received written instructions regarding  medications, activity, diet, wound care, and followup.  Followup with  Dr. Edwyna Shell 1 week with a chest x-ray.   FINAL DIAGNOSIS:  Chronic pericarditis.  Final pathology is pending.   Other diagnoses include history of lung cancer, bronchoalveolar stage  IIA in the  past status post previous chemotherapy and radiation as well  as left upper lobectomy.  Other diagnoses include history of thyroid  disease, hypertension, hyperlipidemia, previous laparoscopic  cholecystectomy, cesarean section x2, history of previous tobacco abuse.      Rowe Clack, P.A.-C.      Ines Bloomer, M.D.  Electronically Signed    WEG/MEDQ  D:  11/21/2008  T:  11/22/2008  Job:  [id number]   cc:   Rose Phi. Myna Hidalgo, M.D.

## 2010-08-05 NOTE — Op Note (Signed)
Tina Owen, Tina Owen NO.:  [id number]   MEDICAL RECORD NO.:  [id number]          PATIENT TYPE:  INP   LOCATION:  2309                         FACILITY:  [facility]   PHYSICIAN:  Zenon Mayo, MDDATE OF BIRTH:  31-Mar-1945   DATE OF PROCEDURE:  11/19/2008  DATE OF DISCHARGE:                               OPERATIVE REPORT   PROCEDURE:  Intraoperative transesophageal echocardiogram.   INDICATION:  Pericardial effusion.   DESCRIPTION:  This father is a 65 year old female with history of  hypertension and lung cancer who was found to have a pericardial  effusion on 2-D echo and was brought to the operating room today by Dr.  Edwyna Shell for evacuation of the pericardial effusion.  The patient was  brought to the operating room and placed under general anesthesia.  After confirmation of endotracheal tube placement and orogastric  suctioning, a transesophageal echo probe was placed in the patient's  esophagus without complication.  The four-chamber view of the heart and  revealed what appeared to be a thickened pericardium as well as a very  small pericardial effusion.  The effusion was certainly not large enough  to cause any tamponade physiology, in fact it was difficult to see with  echocardiography.  The left ventricle was imaged in the short axis view  and revealed a left ventricle with normal wall thickness and normal wall  motion.  There were no wall motion abnormalities seen and ejection  fraction was estimated to be 55%.  The right ventricle in short axis  view also appeared to contract well and once again no tamponade  physiology seen within the right ventricle.  The mitral valve was imaged  next and revealed a normal-appearing valve that coapted well.  The valve  was free from calcification or vegetation.  One color Doppler was placed  across the valve.  A trace amount of mitral regurgitation was seen.  The  aortic valve was imaged next and revealed a  trileaflet valve free from  calcification or vegetations.  The valve leaflets appeared to open well  and coapt well.  With color Doppler was placed across the valve, there  was a centralized jet of aortic insufficiency that was categorized as  mild in nature.  The pulmonic valve was difficult to visualize.  The  tricuspid valve appeared normal in structure and function and no  regurgitation was seen.  The interatrial septum was evaluated with a  bubble study which was negative for any atrial septal defects.  At the  conclusion of the procedure, the transesophageal echo probe was removed  from the patient's esophagus without complication.  The patient was  extubated at the completion of the procedure and taken to the recovery  room in stable fashion.           ______________________________  Zenon Mayo, MD     WEF/MEDQ  D:  11/19/2008  T:  11/20/2008  Job:  [id number]

## 2010-08-05 NOTE — Op Note (Signed)
Tina Owen, Tina Owen NO.:  [id number]   MEDICAL RECORD NO.:  [id number]          PATIENT TYPE:  INP   LOCATION:  2309                         FACILITY:  [facility]   PHYSICIAN:  Ines Bloomer, M.D. DATE OF BIRTH:  December 29, 1945   DATE OF PROCEDURE:  DATE OF DISCHARGE:                               OPERATIVE REPORT   PREOPERATIVE DIAGNOSIS:  Pericardial effusion.   POSTOPERATIVE DIAGNOSIS:  Chronic pericarditis.   OPERATION PERFORMED:  Subxiphoid pericardial window.   SURGEON:  Ines Bloomer, MD   ANESTHESIA:  General.   This patient had onset a history of lung cancer 12 years ago, presented  with increasing dyspnea.  A CT scan showed a moderate pericardial  effusion.  This was confirmed with a 2-D echo with some questionable  stranding.  She was referred for pericardial window.  She underwent  general anesthesia and a transesophageal echo was done, which showed  good function of her heart, but with minimal fluid.  The previous CT  scan was done about 3-4 weeks previously to the surgery.  A 3-4 cm  incision was made over the xiphoid and dissection was carried down  through the subcutaneous tissue down to the peritoneum, then we  dissected up superiorly toward the diaphragm.  The peritoneum was  inadvertently entered and was sutured with 3-0 silk.  The pericardium  was identified, it was very thickened.  The pericardium was grasped with  the pericardial hook and opened and on entering the pericardium, there  was minimal fluid, but a lot of reaction.  We were able to freed the  right ventricle and left ventricle off the diaphragm and did a window  with electrocautery taking that about 2-3 cm pericardium, which was also  very thickened.  We then went superiorly on the heart and again freed up  the pericardium from the epicardium.  We then placed a 28 right-angle  chest tube in the diaphragmatic surface of the heart and closed the  chest with interrupted 0 Vicryl  and 3-0 Vicryl with subcuticular stitch.  The patient was returned to the recovery room in stable condition.      Ines Bloomer, M.D.  Electronically Signed     DPB/MEDQ  D:  11/19/2008  T:  11/20/2008  Job:  [id number]

## 2010-10-22 HISTORY — PX: TRANSTHORACIC ECHOCARDIOGRAM: SHX275

## 2010-10-24 LAB — HM MAMMOGRAPHY: HM Mammogram: NEGATIVE

## 2011-02-04 ENCOUNTER — Encounter: Payer: Self-pay | Admitting: Family Medicine

## 2011-02-05 ENCOUNTER — Other Ambulatory Visit (HOSPITAL_COMMUNITY)
Admission: RE | Admit: 2011-02-05 | Discharge: 2011-02-05 | Disposition: A | Discharge status: Home / Self Care | Payer: Medicare Other | Source: Ambulatory Visit | Attending: Family Medicine | Admitting: Family Medicine

## 2011-02-05 ENCOUNTER — Encounter: Payer: Self-pay | Admitting: Family Medicine

## 2011-02-05 ENCOUNTER — Ambulatory Visit (INDEPENDENT_AMBULATORY_CARE_PROVIDER_SITE_OTHER): Payer: Medicare Other | Admitting: Family Medicine

## 2011-02-05 VITALS — BP 130/90 | HR 72 | Ht 67.25 in | Wt 180.0 lb

## 2011-02-05 DIAGNOSIS — E782 Mixed hyperlipidemia: Secondary | ICD-10-CM | POA: Insufficient documentation

## 2011-02-05 DIAGNOSIS — Z0000 Encounter for general adult medical examination without abnormal findings: Secondary | ICD-10-CM

## 2011-02-05 DIAGNOSIS — Z8249 Family history of ischemic heart disease and other diseases of the circulatory system: Secondary | ICD-10-CM

## 2011-02-05 DIAGNOSIS — J309 Allergic rhinitis, unspecified: Secondary | ICD-10-CM | POA: Insufficient documentation

## 2011-02-05 DIAGNOSIS — E039 Hypothyroidism, unspecified: Secondary | ICD-10-CM

## 2011-02-05 DIAGNOSIS — I10 Essential (primary) hypertension: Secondary | ICD-10-CM | POA: Insufficient documentation

## 2011-02-05 DIAGNOSIS — Z1211 Encounter for screening for malignant neoplasm of colon: Secondary | ICD-10-CM

## 2011-02-05 DIAGNOSIS — Z85118 Personal history of other malignant neoplasm of bronchus and lung: Secondary | ICD-10-CM

## 2011-02-05 DIAGNOSIS — M899 Disorder of bone, unspecified: Secondary | ICD-10-CM

## 2011-02-05 DIAGNOSIS — J3089 Other allergic rhinitis: Secondary | ICD-10-CM

## 2011-02-05 DIAGNOSIS — M8580 Other specified disorders of bone density and structure, unspecified site: Secondary | ICD-10-CM

## 2011-02-05 DIAGNOSIS — Z23 Encounter for immunization: Secondary | ICD-10-CM

## 2011-02-05 DIAGNOSIS — Z01419 Encounter for gynecological examination (general) (routine) without abnormal findings: Secondary | ICD-10-CM | POA: Insufficient documentation

## 2011-02-05 DIAGNOSIS — E785 Hyperlipidemia, unspecified: Secondary | ICD-10-CM | POA: Insufficient documentation

## 2011-02-05 LAB — POCT URINALYSIS DIPSTICK
Bilirubin, UA: NEGATIVE
Blood, UA: NEGATIVE
Glucose, UA: NEGATIVE
Nitrite, UA: NEGATIVE
Spec Grav, UA: 1.02
Urobilinogen, UA: NEGATIVE

## 2011-02-05 NOTE — Progress Notes (Signed)
Subjective:    Patient ID: Tina Owen, female    DOB: 07-Oct-1945, 65 y.o.   MRN: [id number]  HPI She is here for medication check up. She is now retired and enjoying her retirement. She continues on her medications listed in the chart and is having no difficulty with them. She was diagnosed with lung cancer several years ago and is now cancer free. She does have a history of osteopenia. She follows up with her cardiologist regularly. She does not smoke. She does drink wine your meals. Her marriage is going well. Family history is unchanged.   Review of Systems  Constitutional: Negative.   HENT: Negative.   Eyes: Negative.   Respiratory: Negative.   Cardiovascular: Negative.   Gastrointestinal: Negative.   Musculoskeletal: Negative.   Skin: Negative.   Neurological: Negative.   Hematological: Negative.   Psychiatric/Behavioral: Negative.        Objective:   Physical Exam BP 130/90  Pulse 72  Ht 5' 7.25" (1.708 m)  Wt 180 lb (81.647 kg)  BMI 27.98 kg/m2  General Appearance:    Alert, cooperative, no distress, appears stated age  Head:    Normocephalic, without obvious abnormality, atraumatic  Eyes:    PERRL, conjunctiva/corneas clear, EOM's intact, fundi    benign  Ears:    Normal TM's and external ear canals  Nose:   Nares normal, mucosa normal, no drainage or sinus   tenderness  Throat:   Lips, mucosa, and tongue normal; teeth and gums normal  Neck:   Supple, no lymphadenopathy;  thyroid:  no   enlargement/tenderness/nodules; no carotid   bruit or JVD  Back:    Spine nontender, no curvature, ROM normal, no CVA     tenderness  Lungs:     Clear to auscultation bilaterally without wheezes, rales or     ronchi; respirations unlabored  Chest Wall:    No tenderness or deformity   Heart:    Regular rate and rhythm, S1 and S2 normal, no murmur, rub   or gallop  Breast Exam:    No tenderness, masses, or nipple discharge or inversion.      No axillary lymphadenopathy    Abdomen:     Soft, non-tender, nondistended, normoactive bowel sounds,    no masses, no hepatosplenomegaly  Genitalia:    Normal external genitalia without lesions.  BUS and vagina normal; cervix without lesions, or cervical motion tenderness. No abnormal vaginal discharge.  Uterus and adnexa not enlarged, nontender, no masses.  Pap performed  Rectal:    Normal tone, no masses or tenderness; guaiac negative stool  Extremities:   No clubbing, cyanosis or edema  Pulses:   2+ and symmetric all extremities  Skin:   Skin color, texture, turgor normal, no rashes or lesions  Lymph nodes:   Cervical, supraclavicular, and axillary nodes normal  Neurologic:   CNII-XII intact, normal strength, sensation and gait; reflexes 2+ and symmetric throughout          Psych:   Normal mood, affect, hygiene and grooming.          Assessment & Plan:   1. Physical exam, annual  POCT Urinalysis Dipstick  2. Need for prophylactic vaccination and inoculation against influenza  Flu vaccine greater than or equal to 3yo preservative free IM  3. Hypertension  CBC with Differential, Comprehensive metabolic panel, Lipid panel  4. Hypothyroid  TSH  5. History of lung cancer    6. Hyperlipidemia LDL goal < 100  Lipid panel  7. Osteopenia  Vitamin D 25 hydroxy  8. Family history of heart disease in female family member before age 50  CBC with Differential, Comprehensive metabolic panel, Lipid panel  9. Allergic rhinitis due to other allergen     She'll continue to take excellent care of herself and stay on present medications.

## 2011-02-05 NOTE — Patient Instructions (Signed)
Continue on all your present medications.

## 2011-02-06 LAB — CBC WITH DIFFERENTIAL/PLATELET
Basophils Relative: 0 % (ref 0–1)
Eosinophils Absolute: 0.1 10*3/uL (ref 0.0–0.7)
HCT: 43.5 % (ref 36.0–46.0)
Hemoglobin: 14.2 g/dL (ref 12.0–15.0)
MCH: 30.7 pg (ref 26.0–34.0)
MCHC: 32.6 g/dL (ref 30.0–36.0)
MCV: 94.2 fL (ref 78.0–100.0)
Monocytes Absolute: 0.7 10*3/uL (ref 0.1–1.0)
Monocytes Relative: 10 % (ref 3–12)

## 2011-02-06 LAB — LIPID PANEL
Cholesterol: 210 mg/dL — ABNORMAL HIGH (ref 0–200)
HDL: 69 mg/dL (ref >39)
LDL Cholesterol: 120 mg/dL — ABNORMAL HIGH (ref 0–99)
Triglycerides: 106 mg/dL (ref <150)
VLDL: 21 mg/dL (ref 0–40)

## 2011-02-06 LAB — COMPREHENSIVE METABOLIC PANEL
Albumin: 4.5 g/dL (ref 3.5–5.2)
Alkaline Phosphatase: 70 U/L (ref 39–117)
BUN: 24 mg/dL — ABNORMAL HIGH (ref 6–23)
Glucose, Bld: 100 mg/dL — ABNORMAL HIGH (ref 70–99)
Total Bilirubin: 0.8 mg/dL (ref 0.3–1.2)

## 2011-03-16 ENCOUNTER — Other Ambulatory Visit: Payer: Self-pay

## 2011-03-16 ENCOUNTER — Other Ambulatory Visit: Payer: Self-pay | Admitting: Family Medicine

## 2011-03-16 MED ORDER — LEVOTHYROXINE SODIUM 100 MCG PO TABS: 100.0000 ug | ORAL_TABLET | Freq: Every day | ORAL | Status: DC

## 2011-03-16 MED ORDER — ATORVASTATIN CALCIUM 80 MG PO TABS: 80.0000 mg | ORAL_TABLET | Freq: Every day | ORAL | Status: DC

## 2011-03-16 NOTE — Telephone Encounter (Signed)
Pt lipitor and synthroid sent in

## 2011-03-16 NOTE — Telephone Encounter (Signed)
PATIENT ALSO NEEDS SYNTHROID REFILLED    sYNTHROID  100 MCG LIPITOR 80

## 2011-09-03 ENCOUNTER — Other Ambulatory Visit: Payer: Self-pay | Admitting: Family Medicine

## 2011-10-16 ENCOUNTER — Other Ambulatory Visit: Payer: Self-pay | Admitting: Family Medicine

## 2011-11-16 ENCOUNTER — Other Ambulatory Visit: Payer: Self-pay | Admitting: Medical

## 2011-11-16 ENCOUNTER — Telehealth: Payer: Self-pay | Admitting: Internal Medicine

## 2011-11-16 MED ORDER — ATORVASTATIN CALCIUM 80 MG PO TABS: 80.0000 mg | ORAL_TABLET | Freq: Every day | ORAL | Status: DC

## 2011-11-16 NOTE — Telephone Encounter (Signed)
Okay to renew the meds

## 2011-11-16 NOTE — Telephone Encounter (Signed)
Pt is coming in November 18 for her physical pt wants to know if it is okay to get her lipitor filled til then

## 2011-11-16 NOTE — Telephone Encounter (Signed)
SENT PT MED IN TILL NOV.

## 2012-01-29 ENCOUNTER — Encounter: Payer: Self-pay | Admitting: Internal Medicine

## 2012-02-01 ENCOUNTER — Encounter: Payer: Self-pay | Admitting: Family Medicine

## 2012-02-08 ENCOUNTER — Encounter: Payer: Self-pay | Admitting: Family Medicine

## 2012-02-08 ENCOUNTER — Ambulatory Visit (INDEPENDENT_AMBULATORY_CARE_PROVIDER_SITE_OTHER): Payer: Medicare Other | Admitting: Family Medicine

## 2012-02-08 VITALS — BP 130/80 | HR 73 | Ht 68.0 in | Wt 176.0 lb

## 2012-02-08 DIAGNOSIS — M8580 Other specified disorders of bone density and structure, unspecified site: Secondary | ICD-10-CM

## 2012-02-08 DIAGNOSIS — I10 Essential (primary) hypertension: Secondary | ICD-10-CM

## 2012-02-08 DIAGNOSIS — E039 Hypothyroidism, unspecified: Secondary | ICD-10-CM

## 2012-02-08 DIAGNOSIS — Z79899 Other long term (current) drug therapy: Secondary | ICD-10-CM

## 2012-02-08 DIAGNOSIS — E785 Hyperlipidemia, unspecified: Secondary | ICD-10-CM

## 2012-02-08 DIAGNOSIS — Z23 Encounter for immunization: Secondary | ICD-10-CM

## 2012-02-08 DIAGNOSIS — Z85118 Personal history of other malignant neoplasm of bronchus and lung: Secondary | ICD-10-CM

## 2012-02-08 DIAGNOSIS — J3089 Other allergic rhinitis: Secondary | ICD-10-CM

## 2012-02-08 DIAGNOSIS — Z8249 Family history of ischemic heart disease and other diseases of the circulatory system: Secondary | ICD-10-CM

## 2012-02-08 DIAGNOSIS — M899 Disorder of bone, unspecified: Secondary | ICD-10-CM

## 2012-02-08 MED ORDER — ATORVASTATIN CALCIUM 80 MG PO TABS: 80.0000 mg | ORAL_TABLET | Freq: Every day | ORAL | Status: DC

## 2012-02-08 MED ORDER — LEVOTHYROXINE SODIUM 100 MCG PO TABS: 100.0000 ug | ORAL_TABLET | Freq: Every day | ORAL | Status: DC

## 2012-02-08 NOTE — Progress Notes (Signed)
  Subjective:    Patient ID: Tina Owen, female    DOB: Nov 25, 1945, 66 y.o.   MRN: [id number]  HPI She is here for an interval evaluation. She has a remote history of lung cancer and did get radiation as well as chemotherapy. Subsequent to that she did have difficulty with radiation related. Cardio problems. She is being followed by cardiology for that. She was seen within the last year for this. She also has a history of osteopenia however review of record indicates no DEXA has been ordered recently. She continues on her thyroid medication. Her allergies are under good control and mainly occur in the spring. She rarely uses medications for this. 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 She has no other concerns or complaints.   Review of Systems     Objective:   Physical Exam BP 130/80  Pulse 73  Ht 5\' 8"  (1.727 m)  Wt 176 lb (79.833 kg)  BMI 26.76 kg/m2  SpO2 98%  General Appearance:    Alert, cooperative, no distress, appears stated age  Head:    Normocephalic, without obvious abnormality, atraumatic  Eyes:    PERRL, conjunctiva/corneas clear, EOM's intact, fundi    benign  Ears:    Normal TM's and external ear canals  Nose:   Nares normal, mucosa normal, no drainage or sinus   tenderness  Throat:   Lips, mucosa, and tongue normal; teeth and gums normal  Neck:   Supple, no lymphadenopathy;  thyroid:  no   enlargement/tenderness/nodules; no carotid   bruit or JVD  Back:    Spine nontender, no curvature, ROM normal, no CVA     tenderness  Lungs:     Clear to auscultation bilaterally without wheezes, rales or     ronchi; respirations unlabored  Chest Wall:    No tenderness or deformity   Heart:    Regular rate and rhythm, S1 and S2 normal, no murmur, rub   or gallop  Breast Exam:    Deferred to GYN  Abdomen:     Soft, non-tender, nondistended, normoactive bowel sounds,    no masses, no hepatosplenomegaly  Genitalia:    Deferred to GYN     Extremities:    No clubbing, cyanosis or edema  Pulses:   2+ and symmetric all extremities  Skin:   Skin color, texture, turgor normal, no rashes or lesions  Lymph nodes:   Cervical, supraclavicular, and axillary nodes normal  Neurologic:   CNII-XII intact, normal strength, sensation and gait; reflexes 2+ and symmetric throughout          Psych:   Normal mood, affect, hygiene and grooming.           Assessment & Plan:   1. Osteopenia  DG Bone Density  2. Hypertension    3. Hypothyroid  levothyroxine (SYNTHROID, LEVOTHROID) 100 MCG tablet  4. History of lung cancer  Pneumococcal polysaccharide vaccine 23-valent greater than or equal to 2yo subcutaneous/IM, CBC with Differential, Comprehensive metabolic panel  5. Hyperlipidemia LDL goal < 100  atorvastatin (LIPITOR) 80 MG tablet, Lipid panel  6. Family history of heart disease in female family member before age 55  Lipid panel  7. Allergic rhinitis due to other allergen    8. Encounter for long-term (current) use of other medications  atorvastatin (LIPITOR) 80 MG tablet, levothyroxine (SYNTHROID, LEVOTHROID) 100 MCG tablet, CBC with Differential, Comprehensive metabolic panel, Lipid panel   Advanced directive information was given.

## 2012-02-09 LAB — COMPREHENSIVE METABOLIC PANEL
AST: 16 U/L (ref 0–37)
Albumin: 4.6 g/dL (ref 3.5–5.2)
Alkaline Phosphatase: 68 U/L (ref 39–117)
BUN: 25 mg/dL — ABNORMAL HIGH (ref 6–23)
Glucose, Bld: 105 mg/dL — ABNORMAL HIGH (ref 70–99)
Potassium: 4.2 mEq/L (ref 3.5–5.3)
Sodium: 142 mEq/L (ref 135–145)
Total Bilirubin: 0.8 mg/dL (ref 0.3–1.2)

## 2012-02-09 LAB — LIPID PANEL
Cholesterol: 163 mg/dL (ref 0–200)
Total CHOL/HDL Ratio: 3.1 Ratio
Triglycerides: 124 mg/dL (ref <150)
VLDL: 25 mg/dL (ref 0–40)

## 2012-02-09 LAB — CBC WITH DIFFERENTIAL/PLATELET
Basophils Absolute: 0 10*3/uL (ref 0.0–0.1)
Basophils Relative: 1 % (ref 0–1)
Eosinophils Absolute: 0.2 10*3/uL (ref 0.0–0.7)
MCH: 30.9 pg (ref 26.0–34.0)
MCHC: 34.4 g/dL (ref 30.0–36.0)
Monocytes Absolute: 0.6 10*3/uL (ref 0.1–1.0)
Neutro Abs: 3.8 10*3/uL (ref 1.7–7.7)
Neutrophils Relative %: 57 % (ref 43–77)
RDW: 13.4 % (ref 11.5–15.5)

## 2012-02-09 MED ORDER — ATORVASTATIN CALCIUM 80 MG PO TABS: 80.0000 mg | ORAL_TABLET | Freq: Every day | ORAL | Status: DC

## 2012-02-09 NOTE — Addendum Note (Signed)
Addended by: Ronnald Nian on: 02/09/2012 09:07 AM   Modules accepted: Orders

## 2012-02-09 NOTE — Progress Notes (Signed)
Quick Note:  Called pt cell # left her blood work including thyroid looks good and to continue present med regimen ______

## 2012-10-05 ENCOUNTER — Encounter: Payer: Self-pay | Admitting: Internal Medicine

## 2012-10-14 ENCOUNTER — Encounter: Payer: Self-pay | Admitting: Internal Medicine

## 2012-10-26 ENCOUNTER — Encounter: Payer: Self-pay | Admitting: *Deleted

## 2012-10-28 ENCOUNTER — Ambulatory Visit (INDEPENDENT_AMBULATORY_CARE_PROVIDER_SITE_OTHER): Payer: Medicare Other | Admitting: Internal Medicine

## 2012-10-28 ENCOUNTER — Encounter: Payer: Self-pay | Admitting: Internal Medicine

## 2012-10-28 VITALS — BP 132/78 | HR 77 | Ht 68.0 in | Wt 178.5 lb

## 2012-10-28 DIAGNOSIS — I3139 Other pericardial effusion (noninflammatory): Secondary | ICD-10-CM | POA: Insufficient documentation

## 2012-10-28 DIAGNOSIS — I10 Essential (primary) hypertension: Secondary | ICD-10-CM

## 2012-10-28 DIAGNOSIS — I319 Disease of pericardium, unspecified: Secondary | ICD-10-CM

## 2012-10-28 DIAGNOSIS — I313 Pericardial effusion (noninflammatory): Secondary | ICD-10-CM | POA: Insufficient documentation

## 2012-10-28 DIAGNOSIS — I359 Nonrheumatic aortic valve disorder, unspecified: Secondary | ICD-10-CM

## 2012-10-28 DIAGNOSIS — I351 Nonrheumatic aortic (valve) insufficiency: Secondary | ICD-10-CM

## 2012-10-28 DIAGNOSIS — Z85118 Personal history of other malignant neoplasm of bronchus and lung: Secondary | ICD-10-CM

## 2012-10-28 DIAGNOSIS — E785 Hyperlipidemia, unspecified: Secondary | ICD-10-CM

## 2012-10-28 MED ORDER — NEBIVOLOL HCL 10 MG PO TABS: 10.0000 mg | ORAL_TABLET | Freq: Every day | ORAL | Status: DC

## 2012-10-28 NOTE — Progress Notes (Signed)
OFFICE NOTE  Chief Complaint:  Routine followup  Primary Care Physician: Carollee Herter, MD  HPI:  Tina Owen is a pleasant 67 year old female previously followed by Dr. Clarene Duke with a history of lung cancer status post radiation and chemotherapy. She also has a history of pericardial effusion which she developed almost 12 years after this episode. She underwent pericardial window and 2010 and has had no reoccurrence. She is on Lipitor for dyslipidemia without any issues and takes low-dose Bystolic. She has been exercising recently without any symptoms such as shortness of breath, worsening chest pain, presyncope or syncopal symptoms.  PMHx:  Past Medical History  Diagnosis Date  . Allergy     SEASONAL  . Cancer     LUNG, 1998 - surgery, chemo, radiation  . Hypertension   . Hypothyroid   . Dyslipidemia   . Osteoporosis   . Colonic polyp 2004  . FHx: cardiovascular disease     Past Surgical History  Procedure Laterality Date  . Tubal ligation    . Cholecystectomy  1998  . Cesarean section  4540,9811    x2  . Transthoracic echocardiogram  10/2010    EF>55%; LA mild-mod dilated; borderline RA englargement; mild MR, trace TR, mild AV regurg, trace PV regurg   . Lung cancer surgery      L upper lobectomy    . Pericardial window  2010    Dr Edwyna Shell    FAMHx:  Family History  Problem Relation Age of Onset  . Hypertension Mother   . Heart disease Mother   . Hypertension Father   . Heart disease Father   . Heart disease Maternal Grandmother   . Heart disease Maternal Grandfather   . Cancer Brother     SOCHx:   reports that she quit smoking about 17 years ago. She has never used smokeless tobacco. She reports that she drinks about 4.2 ounces of alcohol per week. She reports that she does not use illicit drugs.  ALLERGIES:  Allergies  Allergen Reactions  . Tape     ROS: A comprehensive review of systems was negative.  HOME MEDS: Current Outpatient  Prescriptions  Medication Sig Dispense Refill  . aspirin 81 MG tablet Take 81 mg by mouth daily.      Marland Kitchen atorvastatin (LIPITOR) 80 MG tablet Take 1 tablet (80 mg total) by mouth daily.  90 tablet  3  . calcium carbonate (OS-CAL) 600 MG TABS Take 600 mg by mouth 2 (two) times daily with a meal. Calcium plus D      . cholecalciferol (VITAMIN D) 1000 UNITS tablet Take 2,000 Units by mouth daily.       Marland Kitchen levothyroxine (SYNTHROID, LEVOTHROID) 100 MCG tablet Take 1 tablet (100 mcg total) by mouth daily.  90 tablet  prn  . Multiple Vitamin (MULTIVITAMIN) capsule Take 1 capsule by mouth daily.        . nebivolol (BYSTOLIC) 10 MG tablet Take 1 tablet (10 mg total) by mouth daily.  90 tablet  3   No current facility-administered medications for this visit.    LABS/IMAGING: No results found for this or any previous visit (from the past 48 hour(s)). No results found.  VITALS: BP 132/78  Pulse 77  Ht 5\' 8"  (1.727 m)  Wt 178 lb 8 oz (80.967 kg)  BMI 27.15 kg/m2  EXAM: General appearance: alert and no distress Neck: no adenopathy, no carotid bruit, no JVD, supple, symmetrical, trachea midline and thyroid not enlarged, symmetric, no tenderness/mass/nodules  Lungs: clear to auscultation bilaterally Heart: regular rate and rhythm, S1, S2 normal, no murmur, click, rub or gallop Abdomen: soft, non-tender; bowel sounds normal; no masses,  no organomegaly Extremities: extremities normal, atraumatic, no cyanosis or edema Pulses: 2+ and symmetric Skin: Skin color, texture, turgor normal. No rashes or lesions Neurologic: Grossly normal  EKG: Sinus rhythm with occasional PVCs at 77  ASSESSMENT: 1. Remote pericardial effusion status post pericardial window 2. Hyperlipidemia 3. Hypertension-controlled 4. History of lung cancer  PLAN: 1.   Tina Owen is doing very well, and has fortunately lived many years after lung cancer. She has had no recurrence of her pericardial effusion. We can continue to see  her back annually she prefers or sooner as needed.  Tina Nose, MD, [facility] Attending Cardiologist [facility]  Tina Owen C 10/28/2012, 5:18 PM

## 2012-10-28 NOTE — Patient Instructions (Addendum)
Dr Rennis Golden wants to see you back in one year.  He has ordered an echocardiogram to be done prior to your yearly office visit.

## 2012-11-08 ENCOUNTER — Encounter: Payer: Self-pay | Admitting: Internal Medicine

## 2012-11-22 ENCOUNTER — Ambulatory Visit (AMBULATORY_SURGERY_CENTER): Payer: PRIVATE HEALTH INSURANCE

## 2012-11-22 VITALS — Ht 68.0 in | Wt 180.6 lb

## 2012-11-22 DIAGNOSIS — Z86010 Personal history of colonic polyps: Secondary | ICD-10-CM

## 2012-11-22 MED ORDER — MOVIPREP 100 G PO SOLR: ORAL | Status: DC

## 2012-12-13 ENCOUNTER — Ambulatory Visit (AMBULATORY_SURGERY_CENTER): Payer: Medicare Other | Admitting: Internal Medicine

## 2012-12-13 ENCOUNTER — Encounter: Payer: Self-pay | Admitting: Internal Medicine

## 2012-12-13 VITALS — BP 122/65 | HR 56 | Temp 97.9°F | Resp 11 | Ht 68.0 in | Wt 180.0 lb

## 2012-12-13 DIAGNOSIS — Z800 Family history of malignant neoplasm of digestive organs: Secondary | ICD-10-CM

## 2012-12-13 DIAGNOSIS — Z86010 Personal history of colonic polyps: Secondary | ICD-10-CM

## 2012-12-13 MED ORDER — SODIUM CHLORIDE 0.9 % IV SOLN: 500.0000 mL | INTRAVENOUS | Status: DC

## 2012-12-13 NOTE — Op Note (Signed)
[facility] [address] [place], [address]   COLONOSCOPY PROCEDURE REPORT  PATIENT: Tina Owen, Tina Owen  MR#: [id number] BIRTHDATE: Jan 31, 1946 , 67  yrs. old GENDER: Female ENDOSCOPIST: Roxy Cedar, MD REFERRED NG:[id number] Program Recall PROCEDURE DATE:  12/13/2012 PROCEDURE:   Colonoscopy, surveillance First Screening Colonoscopy - Avg.  risk and is 50 yrs.  old or older - No.  Prior Negative Screening - Now for repeat screening. N/A  History of Adenoma - Now for follow-up colonoscopy & has been > or = to 3 yrs.  Yes hx of adenoma.  Has been 3 or more years since last colonoscopy.  Polyps Removed Today? No.  Recommend repeat exam, <10 yrs? Yes.  High risk (family or personal hx). ASA CLASS:   Class II INDICATIONS:Patient's personal history of adenomatous colon polyps (1997, 2000, 2004,2009) and Patient's immediate family history of colon cancer (brother 37). MEDICATIONS: MAC sedation, administered by CRNA and propofol (Diprivan) 250mg  IV  DESCRIPTION OF PROCEDURE:   After the risks benefits and alternatives of the procedure were thoroughly explained, informed consent was obtained.  A digital rectal exam revealed no abnormalities of the rectum.   The LB [id number]  endoscope was introduced through the anus and advanced to the cecum, which was identified by both the appendix and ileocecal valve. No adverse events experienced.   The quality of the prep was excellent, using MoviPrep  The instrument was then slowly withdrawn as the colon was fully examined.    COLON FINDINGS: Mild diverticulosis was noted in the sigmoid colon. The colon was otherwise normal.  There was no inflammation, polyps or cancers .  Retroflexed views revealed internal hemorrhoids. The time to cecum=3 minutes 02 seconds.  Withdrawal time=9 minutes 46 seconds.  The scope was withdrawn and the procedure completed.  COMPLICATIONS: There were no complications.  ENDOSCOPIC  IMPRESSION: 1.   Mild diverticulosis was noted in the sigmoid colon 2.   The colon was otherwise normal  RECOMMENDATIONS: 1. Follow up colonoscopy in 5 years (personal and family hx)   eSigned:  Roxy Cedar, MD 12/13/2012 12:11 PM   cc: Sharlot Gowda, MD and The Patient   PATIENT NAME:  Tina, Owen MR#: [id number]

## 2012-12-13 NOTE — Progress Notes (Signed)
No egg or soy allergy. ewm 

## 2012-12-13 NOTE — Progress Notes (Signed)
Patient did not have preoperative order for IV antibiotic SSI prophylaxis. (G8918)  Patient did not experience any of the following events: a burn prior to discharge; a fall within the facility; wrong site/side/patient/procedure/implant event; or a hospital transfer or hospital admission upon discharge from the facility. (G8907)  

## 2012-12-13 NOTE — Progress Notes (Signed)
Report to pacu rn, vss, bbs=clear 

## 2012-12-13 NOTE — Patient Instructions (Addendum)
YOU HAD AN ENDOSCOPIC PROCEDURE TODAY AT THE  ENDOSCOPY CENTER: Refer to the procedure report that was given to you for any specific questions about what was found during the examination.  If the procedure report does not answer your questions, please call your gastroenterologist to clarify.  If you requested that your care partner not be given the details of your procedure findings, then the procedure report has been included in a sealed envelope for you to review at your convenience later.  YOU SHOULD EXPECT: Some feelings of bloating in the abdomen. Passage of more gas than usual.  Walking can help get rid of the air that was put into your GI tract during the procedure and reduce the bloating. If you had a lower endoscopy (such as a colonoscopy or flexible sigmoidoscopy) you may notice spotting of blood in your stool or on the toilet paper. If you underwent a bowel prep for your procedure, then you may not have a normal bowel movement for a few days.  DIET: Your first meal following the procedure should be a light meal and then it is ok to progress to your normal diet.  A half-sandwich or bowl of soup is an example of a good first meal.  Heavy or fried foods are harder to digest and may make you feel nauseous or bloated.  Likewise meals heavy in dairy and vegetables can cause extra gas to form and this can also increase the bloating.  Drink plenty of fluids but you should avoid alcoholic beverages for 24 hours. Try to increase the fiber in your diet so you don't develop Diverticulitis ACTIVITY: Your care partner should take you home directly after the procedure.  You should plan to take it easy, moving slowly for the rest of the day.  You can resume normal activity the day after the procedure however you should NOT DRIVE or use heavy machinery for 24 hours (because of the sedation medicines used during the test).    SYMPTOMS TO REPORT IMMEDIATELY: A gastroenterologist can be reached at any hour.   During normal business hours, 8:30 AM to 5:00 PM Monday through Friday, call [phone].  After hours and on weekends, please call the GI answering service at [phone] who will take a message and have the physician on call contact you.   Following lower endoscopy (colonoscopy or flexible sigmoidoscopy):  Excessive amounts of blood in the stool  Significant tenderness or worsening of abdominal pains  Swelling of the abdomen that is new, acute  Fever of 100F or higher  FOLLOW UP: If any biopsies were taken you will be contacted by phone or by letter within the next 1-3 weeks.  Call your gastroenterologist if you have not heard about the biopsies in 3 weeks.  Our staff will call the home number listed on your records the next business day following your procedure to check on you and address any questions or concerns that you may have at that time regarding the information given to you following your procedure. This is a courtesy call and so if there is no answer at the home number and we have not heard from you through the emergency physician on call, we will assume that you have returned to your regular daily activities without incident.  SIGNATURES/CONFIDENTIALITY: You and/or your care partner have signed paperwork which will be entered into your electronic medical record.  These signatures attest to the fact that that the information above on your After Visit Summary has been  reviewed and is understood.  Full responsibility of the confidentiality of this discharge information lies with you and/or your care-partner. 

## 2012-12-13 NOTE — Progress Notes (Signed)
Report given to Suzanne Hodges, RN to resume care of the pt. Maw   

## 2012-12-14 ENCOUNTER — Telehealth: Payer: Self-pay | Admitting: *Deleted

## 2012-12-14 NOTE — Telephone Encounter (Signed)
  Follow up Call-  Call back number 12/13/2012  Post procedure Call Back phone  # [phone]  Permission to leave phone message Yes     Patient questions:  Do you have a fever, pain , or abdominal swelling? no Pain Score  0 *  Have you tolerated food without any problems? yes  Have you been able to return to your normal activities? yes  Do you have any questions about your discharge instructions: Diet   no Medications  no Follow up visit  no  Do you have questions or concerns about your Care? no  Actions: * If pain score is 4 or above: No action needed, pain <4.

## 2013-01-18 ENCOUNTER — Encounter: Payer: Self-pay | Admitting: Internal Medicine

## 2013-01-30 ENCOUNTER — Encounter: Payer: Self-pay | Admitting: Family Medicine

## 2013-02-06 ENCOUNTER — Encounter: Payer: Self-pay | Admitting: Family Medicine

## 2013-02-06 ENCOUNTER — Ambulatory Visit (INDEPENDENT_AMBULATORY_CARE_PROVIDER_SITE_OTHER): Payer: Medicare Other | Admitting: Family Medicine

## 2013-02-06 VITALS — BP 124/80 | HR 80 | Ht 67.0 in | Wt 173.0 lb

## 2013-02-06 DIAGNOSIS — Z85118 Personal history of other malignant neoplasm of bronchus and lung: Secondary | ICD-10-CM

## 2013-02-06 DIAGNOSIS — E785 Hyperlipidemia, unspecified: Secondary | ICD-10-CM

## 2013-02-06 DIAGNOSIS — R82998 Other abnormal findings in urine: Secondary | ICD-10-CM

## 2013-02-06 DIAGNOSIS — Z0000 Encounter for general adult medical examination without abnormal findings: Secondary | ICD-10-CM

## 2013-02-06 DIAGNOSIS — M8580 Other specified disorders of bone density and structure, unspecified site: Secondary | ICD-10-CM

## 2013-02-06 DIAGNOSIS — R8271 Bacteriuria: Secondary | ICD-10-CM

## 2013-02-06 DIAGNOSIS — I10 Essential (primary) hypertension: Secondary | ICD-10-CM

## 2013-02-06 DIAGNOSIS — E039 Hypothyroidism, unspecified: Secondary | ICD-10-CM

## 2013-02-06 DIAGNOSIS — J3089 Other allergic rhinitis: Secondary | ICD-10-CM

## 2013-02-06 DIAGNOSIS — M899 Disorder of bone, unspecified: Secondary | ICD-10-CM

## 2013-02-06 DIAGNOSIS — Z8249 Family history of ischemic heart disease and other diseases of the circulatory system: Secondary | ICD-10-CM

## 2013-02-06 LAB — CBC WITH DIFFERENTIAL/PLATELET
Basophils Absolute: 0 10*3/uL (ref 0.0–0.1)
Basophils Relative: 1 % (ref 0–1)
Eosinophils Relative: 2 % (ref 0–5)
Lymphocytes Relative: 25 % (ref 12–46)
MCHC: 35.2 g/dL (ref 30.0–36.0)
MCV: 89.9 fL (ref 78.0–100.0)
Neutro Abs: 4 10*3/uL (ref 1.7–7.7)
Neutrophils Relative %: 61 % (ref 43–77)
Platelets: 195 10*3/uL (ref 150–400)
RBC: 4.77 MIL/uL (ref 3.87–5.11)
RDW: 13.5 % (ref 11.5–15.5)
WBC: 6.6 10*3/uL (ref 4.0–10.5)

## 2013-02-06 LAB — POCT URINALYSIS DIPSTICK
Nitrite, UA: POSITIVE
Protein, UA: NEGATIVE
Urobilinogen, UA: NEGATIVE

## 2013-02-06 LAB — COMPREHENSIVE METABOLIC PANEL
ALT: 21 U/L (ref 0–35)
AST: 18 U/L (ref 0–37)
Albumin: 4.5 g/dL (ref 3.5–5.2)
Alkaline Phosphatase: 70 U/L (ref 39–117)
CO2: 29 mEq/L (ref 19–32)
Calcium: 10 mg/dL (ref 8.4–10.5)
Chloride: 103 mEq/L (ref 96–112)
Creat: 0.96 mg/dL (ref 0.50–1.10)
Potassium: 4.4 mEq/L (ref 3.5–5.3)

## 2013-02-06 LAB — LIPID PANEL
HDL: 65 mg/dL (ref >39)
LDL Cholesterol: 121 mg/dL — ABNORMAL HIGH (ref 0–99)
Total CHOL/HDL Ratio: 3.3 Ratio
Triglycerides: 130 mg/dL (ref <150)
VLDL: 26 mg/dL (ref 0–40)

## 2013-02-06 NOTE — Progress Notes (Signed)
Teaching Physician: Sharlot Gowda, MD Dictated By: Judithann Graves  Subjective:  Tina Owen is a 67 y.o. female with PMH of who presents for annual complete physical examination. She has no particular questions or concerns that she would like addressed today. Tina Owen has a PMH of non-small cell lung cancer which is presently in remission following surgery, radiation, chemotherapy. She has no hemoptysis, no cough, no dyspnea. She continues to be followed by cardiology for cardiac sequelae of her radiation therapy.  She takes her calcium and vitamin D daily for her osteopenia but she cannot recall when her last bone mineral density scan was. She remains able to tolerate her current dosage of levothyroxine well with no palpitations or tremors. Tina Owen has a history of allergic rhinitis but has no complaint today of congestion or post-nasal drainage. Social and family history were reviewed.   ROS as in subjective.  Medications: Prior to Admission medications   Medication Sig Start Date End Date Taking? Authorizing Provider  aspirin 81 MG tablet Take 81 mg by mouth daily.   Yes Historical Provider, MD  atorvastatin (LIPITOR) 80 MG tablet Take 1 tablet (80 mg total) by mouth daily. 02/09/12  Yes Ronnald Nian, MD  calcium carbonate (OS-CAL) 600 MG TABS Take 600 mg by mouth daily. Calcium plus D   Yes Historical Provider, MD  cholecalciferol (VITAMIN D) 1000 UNITS tablet Take 2,000 Units by mouth daily.    Yes Historical Provider, MD  levothyroxine (SYNTHROID, LEVOTHROID) 100 MCG tablet Take 1 tablet (100 mcg total) by mouth daily. 02/08/12 02/07/13 Yes Ronnald Nian, MD  Multiple Vitamin (MULTIVITAMIN) capsule Take 1 capsule by mouth daily.     Yes Historical Provider, MD  nebivolol (BYSTOLIC) 10 MG tablet Take 1 tablet (10 mg total) by mouth daily. 10/28/12  Yes Chrystie Nose, MD    Objective: Filed Vitals:   02/06/13 1404  BP: 124/80  Pulse: 80    Physical Exam:  BP 124/80  Pulse  80  Ht 5\' 7"  (1.702 m)  Wt 173 lb (78.472 kg)  BMI 27.09 kg/m2  SpO2 99%  General Appearance:    Alert, cooperative, no distress, appears stated age  Head:    Normocephalic, without obvious abnormality, atraumatic  Eyes:    PERRL, conjunctiva/corneas clear, EOM's intact, fundi    benign  Ears:    Normal TM's and external ear canals  Nose:   Nares normal, mucosa normal, no drainage or sinus   tenderness  Throat:   Lips, mucosa, and tongue normal; teeth and gums normal  Neck:   Supple, no lymphadenopathy;  thyroid:  no   enlargement/tenderness/nodules; no carotid   bruit or JVD  Back:    Spine nontender, no curvature, ROM normal, no CVA     tenderness  Lungs:     Clear to auscultation bilaterally without wheezes, rales or     ronchi; respirations unlabored  Chest Wall:    No tenderness or deformity   Heart:    Regular rate and rhythm, II/VI systolic murmur heard best at LUSB, no rub   or gallop  Breast Exam:    Deferred to GYN  Abdomen:     Soft, non-tender, nondistended, normoactive bowel sounds,    no masses, no hepatosplenomegaly  Genitalia:    Deferred to GYN     Extremities:   No clubbing, cyanosis or edema  Pulses:   2+ and symmetric all extremities  Skin:   Skin color, texture, turgor normal, no  rashes or lesions  Lymph nodes:   Cervical, supraclavicular, and axillary nodes normal  Neurologic:   CNII-XII intact, normal strength, sensation and gait; reflexes 2+ and symmetric throughout          Psych:   Normal mood, affect, hygiene and grooming.     Assessment and Plan: 1. Hypertension Stable on current regimen of nebivolol - POCT Urinalysis Dipstick - CBC with Differential - Comprehensive metabolic panel  2. Bacteriuria, asymptomatic No complaint of dysuria and no suprapubic tenderness on exam. 76% of cases of asymptomatic bacteriuria in older adults spontaneously resolve. Will manage expectantly.  3. Routine general medical examination at a health care facility  4.  Osteopenia Continue vitamin D and calcium supplementation.  5. Hypothyroid Will continue levothyroxine at 100 mcg daily - TSH  6. Hyperlipidemia LDL goal < 100 - Will obtain Lipid panel today  7. History of lung cancer  8. Family history of heart disease in female family member before age 33 - CBC with Differential - Comprehensive metabolic panel - Lipid panel  9. Allergic rhinitis due to other allergen Appears to be well controlled at this time.  Dr. Susann Givens was present for the encounter and agrees with the above assessment and plan.

## 2013-02-07 LAB — TSH: TSH: 0.832 u[IU]/mL (ref 0.350–4.500)

## 2013-03-01 ENCOUNTER — Telehealth: Payer: Self-pay | Admitting: *Deleted

## 2013-03-01 NOTE — Telephone Encounter (Signed)
Faxed to [phone] clearance for dental procedure - no ABX prophylaxis needed, OK for local anesthesia

## 2013-03-06 ENCOUNTER — Other Ambulatory Visit: Payer: Self-pay

## 2013-03-06 ENCOUNTER — Telehealth: Payer: Self-pay | Admitting: Internal Medicine

## 2013-03-06 DIAGNOSIS — Z79899 Other long term (current) drug therapy: Secondary | ICD-10-CM

## 2013-03-06 DIAGNOSIS — E039 Hypothyroidism, unspecified: Secondary | ICD-10-CM

## 2013-03-06 DIAGNOSIS — E785 Hyperlipidemia, unspecified: Secondary | ICD-10-CM

## 2013-03-06 MED ORDER — ATORVASTATIN CALCIUM 80 MG PO TABS: 80.0000 mg | ORAL_TABLET | Freq: Every day | ORAL | Status: DC

## 2013-03-06 MED ORDER — LEVOTHYROXINE SODIUM 100 MCG PO TABS: 100.0000 ug | ORAL_TABLET | Freq: Every day | ORAL | Status: DC

## 2013-03-06 NOTE — Telephone Encounter (Signed)
Refill request for atorvastatin and synthroid to [facility]

## 2013-03-06 NOTE — Telephone Encounter (Signed)
SENT IN CHOL. MED AND LEVOTHYROXINE PER FAX

## 2013-03-06 NOTE — Telephone Encounter (Signed)
SENT 

## 2013-10-26 ENCOUNTER — Telehealth: Payer: Self-pay | Admitting: Internal Medicine

## 2013-10-26 DIAGNOSIS — I313 Pericardial effusion (noninflammatory): Secondary | ICD-10-CM

## 2013-10-26 DIAGNOSIS — I351 Nonrheumatic aortic (valve) insufficiency: Secondary | ICD-10-CM

## 2013-10-26 DIAGNOSIS — I3139 Other pericardial effusion (noninflammatory): Secondary | ICD-10-CM

## 2013-10-26 NOTE — Telephone Encounter (Signed)
Does this patient need an Echo before she sees Dr. Debara Pickett.

## 2013-10-26 NOTE — Telephone Encounter (Signed)
Returned call to patient she stated she was calling to schedule yearly echo before her appointment with Dr.Hilty.Schedulers will call back to schedule.

## 2013-10-30 ENCOUNTER — Telehealth: Payer: Self-pay | Admitting: Family Medicine

## 2013-10-30 NOTE — Telephone Encounter (Signed)
Explain to her that this is probably a hemorrhoid however if she continues to have difficulty, come in for consultation.

## 2013-10-30 NOTE — Telephone Encounter (Signed)
PATIENT SAID SHE WOULD CALL IF IT SHOWS UP AGAIN

## 2013-10-30 NOTE — Telephone Encounter (Signed)
Pt called and stated that over the weekend when she went to the bathroom and wiped there was a little amount of blood. She states it was not in stool and a very little, she wants to know if she should be seen. She was offered and appt but stated she wanted to ask you first. Pt can be reached at [phone].

## 2013-11-01 ENCOUNTER — Ambulatory Visit (HOSPITAL_COMMUNITY)
Admission: RE | Admit: 2013-11-01 | Discharge: 2013-11-01 | Disposition: A | Discharge status: Home / Self Care | Payer: Medicare Other | Source: Ambulatory Visit | Attending: Internal Medicine | Admitting: Internal Medicine

## 2013-11-01 DIAGNOSIS — I319 Disease of pericardium, unspecified: Secondary | ICD-10-CM | POA: Diagnosis not present

## 2013-11-01 DIAGNOSIS — I313 Pericardial effusion (noninflammatory): Secondary | ICD-10-CM

## 2013-11-01 DIAGNOSIS — C3490 Malignant neoplasm of unspecified part of unspecified bronchus or lung: Secondary | ICD-10-CM | POA: Insufficient documentation

## 2013-11-01 DIAGNOSIS — I351 Nonrheumatic aortic (valve) insufficiency: Secondary | ICD-10-CM

## 2013-11-01 DIAGNOSIS — I359 Nonrheumatic aortic valve disorder, unspecified: Secondary | ICD-10-CM

## 2013-11-01 DIAGNOSIS — I3139 Other pericardial effusion (noninflammatory): Secondary | ICD-10-CM

## 2013-11-01 DIAGNOSIS — Z85118 Personal history of other malignant neoplasm of bronchus and lung: Secondary | ICD-10-CM

## 2013-11-01 NOTE — Progress Notes (Signed)
2D Echocardiogram Complete.  11/01/2013   Tina Owen, RDCS

## 2013-11-03 ENCOUNTER — Encounter: Payer: Self-pay | Admitting: Internal Medicine

## 2013-11-05 ENCOUNTER — Other Ambulatory Visit: Payer: Self-pay | Admitting: Internal Medicine

## 2013-11-06 NOTE — Telephone Encounter (Signed)
Rx refill sent to patient pharmacy   

## 2014-02-05 ENCOUNTER — Encounter: Payer: Self-pay | Admitting: Internal Medicine

## 2014-02-05 ENCOUNTER — Ambulatory Visit (INDEPENDENT_AMBULATORY_CARE_PROVIDER_SITE_OTHER): Payer: Medicare Other | Admitting: Internal Medicine

## 2014-02-05 VITALS — BP 140/92 | HR 76 | Ht 68.0 in | Wt 184.9 lb

## 2014-02-05 DIAGNOSIS — Z8249 Family history of ischemic heart disease and other diseases of the circulatory system: Secondary | ICD-10-CM

## 2014-02-05 DIAGNOSIS — I10 Essential (primary) hypertension: Secondary | ICD-10-CM

## 2014-02-05 DIAGNOSIS — E785 Hyperlipidemia, unspecified: Secondary | ICD-10-CM

## 2014-02-05 DIAGNOSIS — Z85118 Personal history of other malignant neoplasm of bronchus and lung: Secondary | ICD-10-CM

## 2014-02-05 LAB — HM MAMMOGRAPHY: HM Mammogram: NEGATIVE

## 2014-02-05 MED ORDER — NEBIVOLOL HCL 10 MG PO TABS: ORAL_TABLET | ORAL | Status: DC

## 2014-02-05 NOTE — Progress Notes (Signed)
OFFICE NOTE  Chief Complaint:  Routine followup  Primary Care Physician: Wyatt Haste, MD  HPI:  Tina Owen is a pleasant 68 year old female previously followed by Dr. Rex Kras with a history of lung cancer status post radiation and chemotherapy. She also has a history of pericardial effusion which she developed almost 12 years after this episode. She underwent pericardial window and 2010 and has had no reoccurrence. She is on Lipitor for dyslipidemia without any issues and takes low-dose Bystolic. She has been exercising recently without any symptoms such as shortness of breath, worsening chest pain, presyncope or syncopal symptoms.  Tina Owen returns today for follow-up. She reports doing fairly well. She denies any chest pain or worsening shortness of breath. She does get a little short of breath particularly when walking up stairs and recently was in [place] and climbed up 4 flights of stairs and became short of breath. That however resolved quickly.  She had a recent echocardiogram which showed a preserved EF of 55-60%, mild mitral regurgitation which is stable, and no recurrent pericardial effusion. She is status post pericardial window. Blood pressure has been reportedly well controlled at home.  PMHx:  Past Medical History  Diagnosis Date  . Allergy     SEASONAL  . Cancer     LUNG, 1998 - surgery, chemo, radiation  . Hypertension   . Hypothyroid   . Dyslipidemia   . Colonic polyp 2004  . FHx: cardiovascular disease   . Heart murmur     Past Surgical History  Procedure Laterality Date  . Tubal ligation    . Cholecystectomy  1998  . Cesarean section  1856,3149    x2  . Transthoracic echocardiogram  10/2010    EF>55%; LA mild-mod dilated; borderline RA englargement; mild MR, trace TR, mild AV regurg, trace PV regurg   . Lung cancer surgery      L upper lobectomy    . Pericardial window  2010    Dr Arlyce Dice    FAMHx:  Family History  Problem  Relation Age of Onset  . Hypertension Mother   . Heart disease Mother   . Hypertension Father   . Heart disease Father   . Heart disease Maternal Grandmother   . Heart disease Maternal Grandfather   . Cancer Brother     SOCHx:   reports that she quit smoking about 18 years ago. She has never used smokeless tobacco. She reports that she drinks about 4.2 oz of alcohol per week. She reports that she does not use illicit drugs.  ALLERGIES:  Allergies  Allergen Reactions  . Tape     Redness and scars    ROS: A comprehensive review of systems was negative except for: Respiratory: positive for dyspnea on exertion  HOME MEDS: Current Outpatient Prescriptions  Medication Sig Dispense Refill  . aspirin 81 MG tablet Take 81 mg by mouth daily.    Marland Kitchen atorvastatin (LIPITOR) 80 MG tablet Take 1 tablet (80 mg total) by mouth daily. 90 tablet 3  . calcium carbonate (OS-CAL) 600 MG TABS Take 600 mg by mouth daily. Calcium plus D    . cholecalciferol (VITAMIN D) 1000 UNITS tablet Take 2,000 Units by mouth daily.     Marland Kitchen levothyroxine (SYNTHROID, LEVOTHROID) 100 MCG tablet Take 1 tablet (100 mcg total) by mouth daily. 90 tablet 3  . Multiple Vitamin (MULTIVITAMIN) capsule Take 1 capsule by mouth daily.      . nebivolol (BYSTOLIC) 10 MG tablet take 1 tablet  by mouth once daily 28 tablet 0   No current facility-administered medications for this visit.    LABS/IMAGING: No results found for this or any previous visit (from the past 48 hour(s)). No results found.  VITALS: BP 140/92 mmHg  Pulse 76  Ht 5\' 8"  (1.727 m)  Wt 184 lb 14.4 oz (83.87 kg)  BMI 28.12 kg/m2  EXAM: General appearance: alert and no distress Neck: no adenopathy, no carotid bruit, no JVD, supple, symmetrical, trachea midline and thyroid not enlarged, symmetric, no tenderness/mass/nodules Lungs: clear to auscultation bilaterally Heart: regular rate and rhythm, S1, S2 normal, no murmur, click, rub or gallop Abdomen: soft,  non-tender; bowel sounds normal; no masses,  no organomegaly Extremities: extremities normal, atraumatic, no cyanosis or edema Pulses: 2+ and symmetric Skin: Skin color, texture, turgor normal. No rashes or lesions Neurologic: Grossly normal  EKG: Sinus rhythm with occasional PVCs at 76  ASSESSMENT: 1. Remote pericardial effusion status post pericardial window - no reoccurence by echo in 10/2013 (EF 55-60%) 2. Mild mitral regurgitation, stable 3. Hyperlipidemia 4. Hypertension-controlled 5. History of lung cancer  PLAN: 1.   Tina Owen is doing very well, and has fortunately lived many years after lung cancer. Blood pressure is been well controlled on diastolic. Her cholesterol is being followed by her primary care provider and she is on Lipitor. She has had no recurrence of her pericardial effusion, as evidenced by her most recent echo in August 2015. She does have stable mild mitral regurgitation. We can continue to see her back annually or sooner as needed.  Pixie Casino, MD, [facility] Attending Cardiologist [facility] 02/05/2014, 12:30 PM

## 2014-02-05 NOTE — Patient Instructions (Signed)
Your physician wants you to follow-up in: 1 year with Dr. Hilty. You will receive a reminder letter in the mail two months in advance. If you don't receive a letter, please call our office to schedule the follow-up appointment.  

## 2014-02-07 ENCOUNTER — Encounter: Payer: Self-pay | Admitting: Family Medicine

## 2014-02-07 ENCOUNTER — Ambulatory Visit (INDEPENDENT_AMBULATORY_CARE_PROVIDER_SITE_OTHER): Payer: Medicare Other | Admitting: Family Medicine

## 2014-02-07 VITALS — BP 124/74 | HR 66 | Ht 67.0 in | Wt 182.0 lb

## 2014-02-07 DIAGNOSIS — I10 Essential (primary) hypertension: Secondary | ICD-10-CM

## 2014-02-07 DIAGNOSIS — M8580 Other specified disorders of bone density and structure, unspecified site: Secondary | ICD-10-CM

## 2014-02-07 DIAGNOSIS — J301 Allergic rhinitis due to pollen: Secondary | ICD-10-CM

## 2014-02-07 DIAGNOSIS — Z8249 Family history of ischemic heart disease and other diseases of the circulatory system: Secondary | ICD-10-CM

## 2014-02-07 DIAGNOSIS — I340 Nonrheumatic mitral (valve) insufficiency: Secondary | ICD-10-CM

## 2014-02-07 DIAGNOSIS — E039 Hypothyroidism, unspecified: Secondary | ICD-10-CM

## 2014-02-07 DIAGNOSIS — Z85118 Personal history of other malignant neoplasm of bronchus and lung: Secondary | ICD-10-CM

## 2014-02-07 DIAGNOSIS — Z0000 Encounter for general adult medical examination without abnormal findings: Secondary | ICD-10-CM

## 2014-02-07 DIAGNOSIS — E785 Hyperlipidemia, unspecified: Secondary | ICD-10-CM

## 2014-02-07 LAB — CBC WITH DIFFERENTIAL/PLATELET
BASOS ABS: 0.1 10*3/uL (ref 0.0–0.1)
Basophils Relative: 1 % (ref 0–1)
Eosinophils Absolute: 0.1 10*3/uL (ref 0.0–0.7)
Eosinophils Relative: 2 % (ref 0–5)
HCT: 41.6 % (ref 36.0–46.0)
HEMOGLOBIN: 14.4 g/dL (ref 12.0–15.0)
LYMPHS ABS: 1.7 10*3/uL (ref 0.7–4.0)
LYMPHS PCT: 27 % (ref 12–46)
MCH: 30.9 pg (ref 26.0–34.0)
MCHC: 34.6 g/dL (ref 30.0–36.0)
MCV: 89.3 fL (ref 78.0–100.0)
MPV: 9.9 fL (ref 9.4–12.4)
Monocytes Absolute: 0.8 10*3/uL (ref 0.1–1.0)
Monocytes Relative: 12 % (ref 3–12)
Neutro Abs: 3.7 10*3/uL (ref 1.7–7.7)
Neutrophils Relative %: 58 % (ref 43–77)
Platelets: 186 10*3/uL (ref 150–400)
RBC: 4.66 MIL/uL (ref 3.87–5.11)
RDW: 13.6 % (ref 11.5–15.5)
WBC: 6.3 10*3/uL (ref 4.0–10.5)

## 2014-02-07 LAB — POCT URINALYSIS DIPSTICK
BILIRUBIN UA: NEGATIVE
Blood, UA: NEGATIVE
GLUCOSE UA: NEGATIVE
Ketones, UA: NEGATIVE
Leukocytes, UA: NEGATIVE
NITRITE UA: NEGATIVE
PH UA: 6
Protein, UA: NEGATIVE
Spec Grav, UA: >=1.03
Urobilinogen, UA: NEGATIVE

## 2014-02-07 LAB — TSH: TSH: 2.221 u[IU]/mL (ref 0.350–4.500)

## 2014-02-07 LAB — LIPID PANEL
CHOL/HDL RATIO: 3.3 ratio
Cholesterol: 213 mg/dL — ABNORMAL HIGH (ref 0–200)
HDL: 64 mg/dL (ref >39)
LDL Cholesterol: 126 mg/dL — ABNORMAL HIGH (ref 0–99)
Triglycerides: 116 mg/dL (ref <150)
VLDL: 23 mg/dL (ref 0–40)

## 2014-02-07 LAB — COMPREHENSIVE METABOLIC PANEL
ALBUMIN: 4.3 g/dL (ref 3.5–5.2)
ALT: 30 U/L (ref 0–35)
AST: 21 U/L (ref 0–37)
Alkaline Phosphatase: 74 U/L (ref 39–117)
BUN: 20 mg/dL (ref 6–23)
CALCIUM: 10.1 mg/dL (ref 8.4–10.5)
CHLORIDE: 104 meq/L (ref 96–112)
CO2: 27 meq/L (ref 19–32)
Creat: 0.92 mg/dL (ref 0.50–1.10)
Glucose, Bld: 96 mg/dL (ref 70–99)
POTASSIUM: 4.1 meq/L (ref 3.5–5.3)
Sodium: 140 mEq/L (ref 135–145)
Total Bilirubin: 0.9 mg/dL (ref 0.2–1.2)
Total Protein: 7.3 g/dL (ref 6.0–8.3)

## 2014-02-07 MED ORDER — ATORVASTATIN CALCIUM 80 MG PO TABS: 80.0000 mg | ORAL_TABLET | Freq: Every day | ORAL | Status: DC

## 2014-02-07 MED ORDER — LEVOTHYROXINE SODIUM 100 MCG PO TABS: 100.0000 ug | ORAL_TABLET | Freq: Every day | ORAL | Status: DC

## 2014-02-07 NOTE — Addendum Note (Signed)
Addended by: Randel Books on: 02/07/2014 03:19 PM   Modules accepted: Orders

## 2014-02-07 NOTE — Progress Notes (Signed)
   Subjective:    Patient ID: Tina Owen, female    DOB: [birth year], 68 y.o.   MRN: [id number]  HPI She is here for complete examination. She does have underlying allergies that are under good control. She also has a family history of heart disease. Her cardiologist regularly due to previous cardiac difficultyshe does have mitral regurgitation. She is symptom free fullness. She has a previous history of lung cancer and has been cancer free for several years. She continues on her thyroid medication. She also has a history of osteopenia but has not had a follow-up DEXA since 2009. Family and social history were reviewed. She is retired and enjoying her retirement. Her marriage is going well.   Review of Systems  All other systems reviewed and are negative.      Objective:   Physical Exam alert and in no distress. Tympanic membranes and canals are normal. Throat is clear. Tonsils are normal. Neck is supple without adenopathy or thyromegaly. Cardiac exam shows a regular sinus rhythm with a 3/6 6.murmur with loud S2 heard best in the left upper chest,no gallops. Lungs are clear to auscultation.abdominal exam shows active bowel sounds without masses or tenderness.        Assessment & Plan:  Routine general medical examination at a health care facility  Allergic rhinitis due to pollen  Family history of heart disease in female family member before age 56 - Plan: Lipid panel  History of lung cancer  Essential hypertension - Plan: CBC with Differential, Comprehensive metabolic panel  Hypothyroidism, unspecified hypothyroidism type - Plan: TSH, levothyroxine (SYNTHROID, LEVOTHROID) 100 MCG tablet  Osteopenia - Plan: DG Bone Density  Hyperlipidemia LDL goal <100 - Plan: atorvastatin (LIPITOR) 80 MG tablet  MR (mitral regurgitation) encouraged her to continue with her present active lifestyle.

## 2014-02-12 LAB — HM DEXA SCAN

## 2014-02-20 ENCOUNTER — Telehealth: Payer: Self-pay

## 2014-02-20 NOTE — Telephone Encounter (Signed)
Called pt to inform her that her dexa has not changed still has low bone mass left message word for word

## 2014-02-22 ENCOUNTER — Encounter: Payer: Self-pay | Admitting: Internal Medicine

## 2014-02-22 ENCOUNTER — Encounter: Payer: Self-pay | Admitting: Family Medicine

## 2014-02-28 ENCOUNTER — Encounter: Payer: Self-pay | Admitting: Internal Medicine

## 2015-01-23 LAB — HM MAMMOGRAPHY: HM Mammogram: NEGATIVE

## 2015-01-29 ENCOUNTER — Encounter: Payer: Self-pay | Admitting: Family Medicine

## 2015-02-05 ENCOUNTER — Ambulatory Visit (INDEPENDENT_AMBULATORY_CARE_PROVIDER_SITE_OTHER): Payer: Medicare Other | Admitting: Internal Medicine

## 2015-02-05 VITALS — BP 134/88 | HR 73 | Ht 67.5 in | Wt 183.8 lb

## 2015-02-05 DIAGNOSIS — Z8249 Family history of ischemic heart disease and other diseases of the circulatory system: Secondary | ICD-10-CM | POA: Diagnosis not present

## 2015-02-05 DIAGNOSIS — E785 Hyperlipidemia, unspecified: Secondary | ICD-10-CM

## 2015-02-05 DIAGNOSIS — I340 Nonrheumatic mitral (valve) insufficiency: Secondary | ICD-10-CM | POA: Diagnosis not present

## 2015-02-05 DIAGNOSIS — I10 Essential (primary) hypertension: Secondary | ICD-10-CM

## 2015-02-05 DIAGNOSIS — Z85118 Personal history of other malignant neoplasm of bronchus and lung: Secondary | ICD-10-CM

## 2015-02-05 NOTE — Patient Instructions (Signed)
Your physician has requested that you have an echocardiogram in about 1 year @ [facility]. Echocardiography is a painless test that uses sound waves to create images of your heart. It provides your doctor with information about the size and shape of your heart and how well your heart's chambers and valves are working. This procedure takes approximately one hour. There are no restrictions for this procedure.   Your physician wants you to follow-up in: 1 year with Dr. Debara Pickett. You will receive a reminder letter in the mail two months in advance. If you don't receive a letter, please call our office to schedule the follow-up appointment.

## 2015-02-06 ENCOUNTER — Encounter: Payer: Self-pay | Admitting: Internal Medicine

## 2015-02-06 NOTE — Progress Notes (Signed)
OFFICE NOTE  Chief Complaint:  Routine followup, mild shortness of breath  Primary Care Physician: Wyatt Haste, MD  HPI:  Tina Owen is a pleasant 69 year old female previously followed by Dr. Rex Kras with a history of lung cancer status post radiation and chemotherapy. She also has a history of pericardial effusion which she developed almost 12 years after this episode. She underwent pericardial window and 2010 and has had no reoccurrence. She is on Lipitor for dyslipidemia without any issues and takes low-dose Bystolic. She has been exercising recently without any symptoms such as shortness of breath, worsening chest pain, presyncope or syncopal symptoms.  Tina Owen returns today for follow-up. She reports doing fairly well. She denies any chest pain or worsening shortness of breath. She does get a little short of breath particularly when walking up stairs and recently was in [place] and climbed up 4 flights of stairs and became short of breath. That however resolved quickly.  She had a recent echocardiogram which showed a preserved EF of 55-60%, mild mitral regurgitation which is stable, and no recurrent pericardial effusion. She is status post pericardial window. Blood pressure has been reportedly well controlled at home.  I saw Tina Owen back today in the office. She reports some mild shortness of breath is not necessarily any worse than it had been previously. Blood pressure is very well controlled. Her weight is stable. She denies any chest pain.  PMHx:  Past Medical History  Diagnosis Date  . Allergy     SEASONAL  . Cancer     LUNG, 1998 - surgery, chemo, radiation  . Hypertension   . Hypothyroid   . Dyslipidemia   . Colonic polyp 2004  . FHx: cardiovascular disease   . Heart murmur     Past Surgical History  Procedure Laterality Date  . Tubal ligation    . Cholecystectomy  1998  . Cesarean section  [id number]    x2  . Transthoracic  echocardiogram  10/2010    EF>55%; LA mild-mod dilated; borderline RA englargement; mild MR, trace TR, mild AV regurg, trace PV regurg   . Lung cancer surgery      L upper lobectomy    . Pericardial window  2010    Dr Arlyce Dice    FAMHx:  Family History  Problem Relation Age of Onset  . Hypertension Mother   . Heart disease Mother   . Hypertension Father   . Heart disease Father   . Heart disease Maternal Grandmother   . Heart disease Maternal Grandfather   . Cancer Brother     SOCHx:   reports that she quit smoking about 19 years ago. She has never used smokeless tobacco. She reports that she drinks about 4.2 oz of alcohol per week. She reports that she does not use illicit drugs.  ALLERGIES:  Allergies  Allergen Reactions  . Tape     Redness and scars    ROS: A comprehensive review of systems was negative except for: Respiratory: positive for dyspnea on exertion  HOME MEDS: Current Outpatient Prescriptions  Medication Sig Dispense Refill  . aspirin 81 MG tablet Take 81 mg by mouth daily.    Marland Kitchen atorvastatin (LIPITOR) 80 MG tablet Take 1 tablet (80 mg total) by mouth daily. 90 tablet 3  . calcium carbonate (OS-CAL) 600 MG TABS Take 600 mg by mouth daily. Calcium plus D    . cholecalciferol (VITAMIN D) 1000 UNITS tablet Take 2,000 Units by mouth daily.     Marland Kitchen  levothyroxine (SYNTHROID, LEVOTHROID) 100 MCG tablet Take 1 tablet (100 mcg total) by mouth daily. 90 tablet 3  . Multiple Vitamin (MULTIVITAMIN) capsule Take 1 capsule by mouth daily.      . nebivolol (BYSTOLIC) 10 MG tablet take 1 tablet by mouth once daily 28 tablet 0   No current facility-administered medications for this visit.    LABS/IMAGING: No results found for this or any previous visit (from the past 48 hour(s)). No results found.  VITALS: BP 134/88 mmHg  Pulse 73  Ht 5' 7.5" (1.715 m)  Wt 183 lb 12.8 oz (83.371 kg)  BMI 28.35 kg/m2  EXAM: General appearance: alert and no distress Neck: no  adenopathy, no carotid bruit, no JVD, supple, symmetrical, trachea midline and thyroid not enlarged, symmetric, no tenderness/mass/nodules Lungs: clear to auscultation bilaterally Heart: regular rate and rhythm, S1, S2 normal and systolic murmur: early systolic 3/6, blowing at apex Abdomen: soft, non-tender; bowel sounds normal; no masses,  no organomegaly Extremities: extremities normal, atraumatic, no cyanosis or edema Pulses: 2+ and symmetric Skin: Skin color, texture, turgor normal. No rashes or lesions Neurologic: Grossly normal  EKG: Sinus rhythm at 73  ASSESSMENT: 1. Remote pericardial effusion status post pericardial window - no reoccurence by echo in 10/2013 (EF 55-60%) 2. Mild mitral regurgitation, stable 3. Hyperlipidemia 4. Hypertension-controlled 5. History of lung cancer  PLAN: 1.   Tina Owen is doing very well. She has stable mild shortness of breath with marked exertion. She has mild mitral regurgitation which is stable. Will plan to reecho that prior to her next follow-up visit in one year. Blood pressure is well controlled. No changes to her medications today. Cholesterol is followed by her primary care provider. Follow-up annually  Pixie Casino, MD, [facility] Attending Cardiologist [place] 02/06/2015, 6:04 PM

## 2015-02-20 ENCOUNTER — Encounter: Payer: Self-pay | Admitting: Family Medicine

## 2015-02-20 ENCOUNTER — Ambulatory Visit (INDEPENDENT_AMBULATORY_CARE_PROVIDER_SITE_OTHER): Payer: Medicare Other | Admitting: Family Medicine

## 2015-02-20 VITALS — BP 124/80 | HR 72 | Ht 67.5 in | Wt 180.0 lb

## 2015-02-20 DIAGNOSIS — L821 Other seborrheic keratosis: Secondary | ICD-10-CM | POA: Diagnosis not present

## 2015-02-20 DIAGNOSIS — Z8249 Family history of ischemic heart disease and other diseases of the circulatory system: Secondary | ICD-10-CM | POA: Diagnosis not present

## 2015-02-20 DIAGNOSIS — I340 Nonrheumatic mitral (valve) insufficiency: Secondary | ICD-10-CM | POA: Diagnosis not present

## 2015-02-20 DIAGNOSIS — Z85118 Personal history of other malignant neoplasm of bronchus and lung: Secondary | ICD-10-CM

## 2015-02-20 DIAGNOSIS — J301 Allergic rhinitis due to pollen: Secondary | ICD-10-CM | POA: Diagnosis not present

## 2015-02-20 DIAGNOSIS — E039 Hypothyroidism, unspecified: Secondary | ICD-10-CM

## 2015-02-20 DIAGNOSIS — E785 Hyperlipidemia, unspecified: Secondary | ICD-10-CM | POA: Diagnosis not present

## 2015-02-20 DIAGNOSIS — I10 Essential (primary) hypertension: Secondary | ICD-10-CM

## 2015-02-20 DIAGNOSIS — M8580 Other specified disorders of bone density and structure, unspecified site: Secondary | ICD-10-CM | POA: Diagnosis not present

## 2015-02-20 DIAGNOSIS — Z0000 Encounter for general adult medical examination without abnormal findings: Secondary | ICD-10-CM

## 2015-02-20 LAB — CBC WITH DIFFERENTIAL/PLATELET
BASOS ABS: 0 10*3/uL (ref 0.0–0.1)
Basophils Relative: 0 % (ref 0–1)
EOS ABS: 0.1 10*3/uL (ref 0.0–0.7)
Eosinophils Relative: 1 % (ref 0–5)
HEMATOCRIT: 43 % (ref 36.0–46.0)
Hemoglobin: 14.4 g/dL (ref 12.0–15.0)
LYMPHS PCT: 21 % (ref 12–46)
Lymphs Abs: 1.3 10*3/uL (ref 0.7–4.0)
MCH: 30.5 pg (ref 26.0–34.0)
MCHC: 33.5 g/dL (ref 30.0–36.0)
MCV: 91.1 fL (ref 78.0–100.0)
MPV: 9.8 fL (ref 8.6–12.4)
Monocytes Absolute: 0.6 10*3/uL (ref 0.1–1.0)
Monocytes Relative: 10 % (ref 3–12)
NEUTROS PCT: 68 % (ref 43–77)
Neutro Abs: 4.3 10*3/uL (ref 1.7–7.7)
PLATELETS: 178 10*3/uL (ref 150–400)
RBC: 4.72 MIL/uL (ref 3.87–5.11)
RDW: 13.1 % (ref 11.5–15.5)
WBC: 6.3 10*3/uL (ref 4.0–10.5)

## 2015-02-20 LAB — COMPREHENSIVE METABOLIC PANEL
ALT: 20 U/L (ref 6–29)
AST: 18 U/L (ref 10–35)
Albumin: 4 g/dL (ref 3.6–5.1)
Alkaline Phosphatase: 66 U/L (ref 33–130)
BUN: 20 mg/dL (ref 7–25)
CO2: 25 mmol/L (ref 20–31)
Calcium: 9.6 mg/dL (ref 8.6–10.4)
Chloride: 104 mmol/L (ref 98–110)
Creat: 1.18 mg/dL — ABNORMAL HIGH (ref 0.60–0.93)
GLUCOSE: 100 mg/dL — AB (ref 65–99)
Potassium: 4.2 mmol/L (ref 3.5–5.3)
Sodium: 140 mmol/L (ref 135–146)
Total Bilirubin: 1.1 mg/dL (ref 0.2–1.2)
Total Protein: 6.7 g/dL (ref 6.1–8.1)

## 2015-02-20 LAB — LIPID PANEL
CHOL/HDL RATIO: 3.1 ratio (ref <=5.0)
Cholesterol: 192 mg/dL (ref 125–200)
HDL: 61 mg/dL (ref >=46)
LDL CALC: 107 mg/dL (ref <130)
TRIGLYCERIDES: 122 mg/dL (ref <150)
VLDL: 24 mg/dL (ref <30)

## 2015-02-20 LAB — POCT URINALYSIS DIPSTICK
Bilirubin, UA: NEGATIVE
GLUCOSE UA: NEGATIVE
Ketones, UA: NEGATIVE
LEUKOCYTES UA: NEGATIVE
NITRITE UA: NEGATIVE
Protein, UA: NEGATIVE
RBC UA: NEGATIVE
Spec Grav, UA: >=1.03
Urobilinogen, UA: NEGATIVE
pH, UA: 6

## 2015-02-20 LAB — TSH: TSH: 2.256 u[IU]/mL (ref 0.350–4.500)

## 2015-02-20 NOTE — Progress Notes (Signed)
Subjective:    Patient ID: Tina Owen, female    DOB: [birth year], 69 y.o.   MRN: [id number]  HPI She is here for a complete examination. She has no particular concerns or complaints. She is retired and recently went on a cruise with her husband. Her husband does have an alcohol problem. His seems to be being taken care of fairly adequately. Cognitively she is doing well. There is no issues with depression. She does have underlying allergies and they are under good She keeps himself physically active. She does have an advanced directive. Patient she has remote history of lung cancer approximately 19 years ago. She developed osteopenia following her chemotherapy. She continues on aspirin, calcium as well as Lipitor and Bystolic. Does have a history of hypothyroidism She does have a history of mitral regurgitation and does see cardiology regularly for this. Is a family history of heart disease. She's had no difficulty with chest pain, shortness of breath, GI issues. Her immunizations, health maintenance, family and social history was reviewed.   Review of Systems     Objective:   Physical Exam BP 124/80 mmHg  Pulse 72  Ht 5' 7.5" (1.715 m)  Wt 180 lb (81.647 kg)  BMI 27.76 kg/m2  General Appearance:    Alert, cooperative, no distress, appears stated age  Head:    Normocephalic, without obvious abnormality, atraumatic  Eyes:    PERRL, conjunctiva/corneas clear, EOM's intact  Ears:    Normal TM's and external ear canals  Nose:   Nares normal, mucosa normal, no drainage or sinus   tenderness  Throat:   Lips, mucosa, and tongue normal; teeth and gums normal  Neck:   Supple, no lymphadenopathy;  thyroid:  no   enlargement/tenderness/nodules; no carotid   bruit or JVD  Back:    Spine nontender, no curvature, ROM normal, no CVA     tenderness  Lungs:     Clear to auscultation bilaterally without wheezes, rales or     ronchi; respirations unlabored  Chest Wall:    No tenderness or deformity     Heart:    Regular rate and rhythm, S1 and S2 normal, no murmur, rub   or gallop  Breast Exam:    Deferred to GYN  Abdomen:     Soft, non-tender, nondistended, normoactive bowel sounds,    no masses, no hepatosplenomegaly  Genitalia:    Deferred to GYN     Extremities:   No clubbing, cyanosis or edema  Pulses:   2+ and symmetric all extremities  Skin:   Skin color, texture, turgor normal, raised round flat pigmented lesions are noted on torso.   Lymph nodes:   Cervical, supraclavicular, and axillary nodes normal  Neurologic:   CNII-XII intact, normal strength, sensation and gait; reflexes 2+ and symmetric throughout          Psych:   Normal mood, affect, hygiene and grooming.         Assessment & Plan:  Routine general medical examination at a health care facility - Plan: POCT Urinalysis Dipstick, CBC with Differential/Platelet, Comprehensive metabolic panel, Lipid panel  History of lung cancer  Allergic rhinitis due to pollen  Seborrheic keratosis  Hyperlipidemia with target LDL less than 100 - Plan: Lipid panel  Essential hypertension - Plan: CBC with Differential/Platelet, Comprehensive metabolic panel  Hypothyroidism, unspecified hypothyroidism type - Plan: TSH  Osteopenia  Mitral regurgitation  Family history of heart disease in female family member before age 46  encouraged her  to remain physically active.

## 2015-03-01 ENCOUNTER — Other Ambulatory Visit: Payer: Self-pay | Admitting: Internal Medicine

## 2015-03-01 ENCOUNTER — Other Ambulatory Visit: Payer: Self-pay | Admitting: Family Medicine

## 2015-03-01 NOTE — Telephone Encounter (Signed)
Pt was in back in November for appt and had blood work at that time. Meds have not been filled since November 2015. i will refill this

## 2015-12-31 ENCOUNTER — Other Ambulatory Visit: Payer: Self-pay

## 2015-12-31 ENCOUNTER — Ambulatory Visit (HOSPITAL_COMMUNITY): Payer: Medicare Other | Attending: Cardiovascular Disease

## 2015-12-31 ENCOUNTER — Other Ambulatory Visit (HOSPITAL_COMMUNITY): Payer: Medicare Other

## 2015-12-31 DIAGNOSIS — E785 Hyperlipidemia, unspecified: Secondary | ICD-10-CM | POA: Insufficient documentation

## 2015-12-31 DIAGNOSIS — I10 Essential (primary) hypertension: Secondary | ICD-10-CM | POA: Diagnosis not present

## 2015-12-31 DIAGNOSIS — Z923 Personal history of irradiation: Secondary | ICD-10-CM | POA: Insufficient documentation

## 2015-12-31 DIAGNOSIS — I351 Nonrheumatic aortic (valve) insufficiency: Secondary | ICD-10-CM | POA: Insufficient documentation

## 2015-12-31 DIAGNOSIS — C3490 Malignant neoplasm of unspecified part of unspecified bronchus or lung: Secondary | ICD-10-CM | POA: Insufficient documentation

## 2015-12-31 DIAGNOSIS — Z8249 Family history of ischemic heart disease and other diseases of the circulatory system: Secondary | ICD-10-CM | POA: Insufficient documentation

## 2015-12-31 DIAGNOSIS — Z9221 Personal history of antineoplastic chemotherapy: Secondary | ICD-10-CM | POA: Diagnosis not present

## 2015-12-31 DIAGNOSIS — I340 Nonrheumatic mitral (valve) insufficiency: Secondary | ICD-10-CM | POA: Diagnosis not present

## 2015-12-31 DIAGNOSIS — I059 Rheumatic mitral valve disease, unspecified: Secondary | ICD-10-CM | POA: Diagnosis present

## 2016-01-27 LAB — HM MAMMOGRAPHY

## 2016-01-28 ENCOUNTER — Encounter: Payer: Self-pay | Admitting: Family Medicine

## 2016-02-19 ENCOUNTER — Ambulatory Visit (INDEPENDENT_AMBULATORY_CARE_PROVIDER_SITE_OTHER): Payer: Medicare Other | Admitting: Internal Medicine

## 2016-02-19 ENCOUNTER — Encounter: Payer: Self-pay | Admitting: Internal Medicine

## 2016-02-19 VITALS — BP 166/86 | HR 74 | Ht 68.0 in | Wt 182.0 lb

## 2016-02-19 DIAGNOSIS — I340 Nonrheumatic mitral (valve) insufficiency: Secondary | ICD-10-CM

## 2016-02-19 DIAGNOSIS — R0609 Other forms of dyspnea: Secondary | ICD-10-CM

## 2016-02-19 DIAGNOSIS — I351 Nonrheumatic aortic (valve) insufficiency: Secondary | ICD-10-CM | POA: Diagnosis not present

## 2016-02-19 DIAGNOSIS — I10 Essential (primary) hypertension: Secondary | ICD-10-CM

## 2016-02-19 DIAGNOSIS — Z85118 Personal history of other malignant neoplasm of bronchus and lung: Secondary | ICD-10-CM

## 2016-02-19 MED ORDER — CHLORTHALIDONE 25 MG PO TABS: 12.5000 mg | ORAL_TABLET | Freq: Every day | ORAL | 3 refills | Status: DC

## 2016-02-19 NOTE — Patient Instructions (Addendum)
Medication Instructions:  START Chlorthalidone 12.'5mg'$  Take half tablet ('25mg'$ ) once a day    Labwork: None   Testing/Procedures: None   Follow-Up: Your physician recommends that you schedule a follow-up appointment in: 4-6 weeks with Dr Debara Pickett.  Any Other Special Instructions Will Be Listed Below (If Applicable).     If you need a refill on your cardiac medications before your next appointment, please call your pharmacy.

## 2016-02-19 NOTE — Progress Notes (Signed)
OFFICE NOTE  Chief Complaint:  Routine followup, mild shortness of breath  Primary Care Physician: Wyatt Haste, MD  HPI:  Tina Owen is a pleasant 70 year old female previously followed by Dr. Rex Kras with a history of lung cancer status post radiation and chemotherapy. She also has a history of pericardial effusion which she developed almost 12 years after this episode. She underwent pericardial window and 2010 and has had no reoccurrence. She is on Lipitor for dyslipidemia without any issues and takes low-dose Bystolic. She has been exercising recently without any symptoms such as shortness of breath, worsening chest pain, presyncope or syncopal symptoms.  Tina Owen returns today for follow-up. She reports doing fairly well. She denies any chest pain or worsening shortness of breath. She does get a little short of breath particularly when walking up stairs and recently was in [place] and climbed up 4 flights of stairs and became short of breath. That however resolved quickly.  She had a recent echocardiogram which showed a preserved EF of 55-60%, mild mitral regurgitation which is stable, and no recurrent pericardial effusion. She is status post pericardial window. Blood pressure has been reportedly well controlled at home.  I saw Tina Owen back today in the office. She reports some mild shortness of breath is not necessarily any worse than it had been previously. Blood pressure is very well controlled. Her weight is stable. She denies any chest pain.  02/19/2016  Tina Owen was seen back in the office today. She had a recent echo which shows no change in her mild degree of AI and MR, with a preserved LVEF. She reports possibly worsening DOE- LV filling pressures are elevated on her echo. She denies chest pain. BP is elevated today as well at 166/86.  PMHx:  Past Medical History:  Diagnosis Date  . Allergy    SEASONAL  . Cancer ([place])    LUNG, 1998 -  surgery, chemo, radiation  . Colonic polyp 2004  . Dyslipidemia   . FHx: cardiovascular disease   . Heart murmur   . Hypertension   . Hypothyroid     Past Surgical History:  Procedure Laterality Date  . CESAREAN SECTION  [id number]   x2  . CHOLECYSTECTOMY  1998  . LUNG CANCER SURGERY     L upper lobectomy    . PERICARDIAL WINDOW  2010   Dr Arlyce Dice  . TRANSTHORACIC ECHOCARDIOGRAM  10/2010   EF>55%; LA mild-mod dilated; borderline RA englargement; mild MR, trace TR, mild AV regurg, trace PV regurg   . TUBAL LIGATION      FAMHx:  Family History  Problem Relation Age of Onset  . Hypertension Mother   . Heart disease Mother   . Hypertension Father   . Heart disease Father   . Heart disease Maternal Grandmother   . Heart disease Maternal Grandfather   . Cancer Brother     SOCHx:   reports that she quit smoking about 20 years ago. She has never used smokeless tobacco. She reports that she drinks about 4.2 oz of alcohol per week . She reports that she does not use drugs.  ALLERGIES:  Allergies  Allergen Reactions  . Tape     Redness and scars    ROS: Pertinent items noted in HPI and remainder of comprehensive ROS otherwise negative.  HOME MEDS: Current Outpatient Prescriptions  Medication Sig Dispense Refill  . aspirin 81 MG tablet Take 81 mg by mouth daily.    Marland Kitchen atorvastatin (LIPITOR) 80  MG tablet take 1 tablet by mouth once daily 90 tablet 3  . BYSTOLIC 10 MG tablet take 1 tablet by mouth once daily 90 tablet 3  . calcium carbonate (OS-CAL) 600 MG TABS Take 600 mg by mouth daily. Calcium plus D    . cholecalciferol (VITAMIN D) 1000 UNITS tablet Take 2,000 Units by mouth daily.     . Multiple Vitamin (MULTIVITAMIN) capsule Take 1 capsule by mouth daily.      Marland Kitchen SYNTHROID 100 MCG tablet take 1 tablet by mouth once daily 90 tablet 3  . chlorthalidone (HYGROTON) 25 MG tablet Take 0.5 tablets (12.5 mg total) by mouth daily. 30 tablet 3   No current facility-administered  medications for this visit.     LABS/IMAGING: No results found for this or any previous visit (from the past 48 hour(s)). No results found.  VITALS: BP (!) 166/86   Pulse 74   Ht '5\' 8"'$  (1.727 m)   Wt 182 lb (82.6 kg)   BMI 27.67 kg/m   EXAM: General appearance: alert and no distress Neck: no adenopathy, no carotid bruit, no JVD, supple, symmetrical, trachea midline and thyroid not enlarged, symmetric, no tenderness/mass/nodules Lungs: clear to auscultation bilaterally Heart: regular rate and rhythm, S1, S2 normal and systolic murmur: early systolic 3/6, blowing at apex Abdomen: soft, non-tender; bowel sounds normal; no masses,  no organomegaly Extremities: extremities normal, atraumatic, no cyanosis or edema Pulses: 2+ and symmetric Skin: Skin color, texture, turgor normal. No rashes or lesions Neurologic: Grossly normal  EKG: Sinus rhythm at 74  ASSESSMENT: 1. Remote pericardial effusion status post pericardial window - no reoccurence by echo in 10/2013 (EF 55-60%) 2. Mild mitral regurgitation, stable 3. Hyperlipidemia 4. Hypertension-controlled 5. History of lung cancer  PLAN: 1.   Tina Owen is perhaps mildly more dyspneic. Her LV filling pressures are elevated. BP is up too. I will start chlorthalidone 12.5 mg daily for BP and diuretic effect. Recheck BMET in 1 week. Follow-up with me in 1 month.  Pixie Casino, MD, [facility] Attending Cardiologist [facility] Hilty 02/19/2016, 8:46 PM

## 2016-02-22 ENCOUNTER — Other Ambulatory Visit: Payer: Self-pay | Admitting: Internal Medicine

## 2016-02-22 ENCOUNTER — Other Ambulatory Visit: Payer: Self-pay | Admitting: Family Medicine

## 2016-02-26 ENCOUNTER — Ambulatory Visit (INDEPENDENT_AMBULATORY_CARE_PROVIDER_SITE_OTHER): Payer: Medicare Other | Admitting: Family Medicine

## 2016-02-26 ENCOUNTER — Encounter: Payer: Self-pay | Admitting: Family Medicine

## 2016-02-26 VITALS — BP 128/80 | HR 73 | Ht 67.0 in | Wt 179.0 lb

## 2016-02-26 DIAGNOSIS — E039 Hypothyroidism, unspecified: Secondary | ICD-10-CM | POA: Diagnosis not present

## 2016-02-26 DIAGNOSIS — Z1159 Encounter for screening for other viral diseases: Secondary | ICD-10-CM | POA: Diagnosis not present

## 2016-02-26 DIAGNOSIS — E785 Hyperlipidemia, unspecified: Secondary | ICD-10-CM | POA: Diagnosis not present

## 2016-02-26 DIAGNOSIS — Z8249 Family history of ischemic heart disease and other diseases of the circulatory system: Secondary | ICD-10-CM

## 2016-02-26 DIAGNOSIS — Z0000 Encounter for general adult medical examination without abnormal findings: Secondary | ICD-10-CM | POA: Diagnosis not present

## 2016-02-26 DIAGNOSIS — J301 Allergic rhinitis due to pollen: Secondary | ICD-10-CM | POA: Diagnosis not present

## 2016-02-26 DIAGNOSIS — I10 Essential (primary) hypertension: Secondary | ICD-10-CM | POA: Diagnosis not present

## 2016-02-26 DIAGNOSIS — Z85118 Personal history of other malignant neoplasm of bronchus and lung: Secondary | ICD-10-CM

## 2016-02-26 LAB — COMPREHENSIVE METABOLIC PANEL
ALT: 19 U/L (ref 6–29)
AST: 19 U/L (ref 10–35)
Albumin: 4.2 g/dL (ref 3.6–5.1)
Alkaline Phosphatase: 70 U/L (ref 33–130)
BUN: 26 mg/dL — ABNORMAL HIGH (ref 7–25)
CO2: 24 mmol/L (ref 20–31)
CREATININE: 1 mg/dL — AB (ref 0.60–0.93)
Calcium: 9.8 mg/dL (ref 8.6–10.4)
Chloride: 103 mmol/L (ref 98–110)
Glucose, Bld: 101 mg/dL — ABNORMAL HIGH (ref 65–99)
Potassium: 4.2 mmol/L (ref 3.5–5.3)
SODIUM: 139 mmol/L (ref 135–146)
Total Bilirubin: 0.8 mg/dL (ref 0.2–1.2)
Total Protein: 6.8 g/dL (ref 6.1–8.1)

## 2016-02-26 LAB — CBC WITH DIFFERENTIAL/PLATELET
Basophils Absolute: 0 cells/uL (ref 0–200)
Basophils Relative: 0 %
EOS PCT: 3 %
Eosinophils Absolute: 195 cells/uL (ref 15–500)
HCT: 43.1 % (ref 35.0–45.0)
Hemoglobin: 14.6 g/dL (ref 11.7–15.5)
LYMPHS ABS: 1560 {cells}/uL (ref 850–3900)
Lymphocytes Relative: 24 %
MCH: 30.1 pg (ref 27.0–33.0)
MCHC: 33.9 g/dL (ref 32.0–36.0)
MCV: 88.9 fL (ref 80.0–100.0)
MPV: 10.5 fL (ref 7.5–12.5)
Monocytes Absolute: 975 cells/uL — ABNORMAL HIGH (ref 200–950)
Monocytes Relative: 15 %
Neutro Abs: 3770 cells/uL (ref 1500–7800)
Neutrophils Relative %: 58 %
Platelets: 192 10*3/uL (ref 140–400)
RBC: 4.85 MIL/uL (ref 3.80–5.10)
RDW: 13.4 % (ref 11.0–15.0)
WBC: 6.5 10*3/uL (ref 4.0–10.5)

## 2016-02-26 LAB — LIPID PANEL
Cholesterol: 199 mg/dL (ref <200)
HDL: 54 mg/dL (ref >50)
LDL Cholesterol: 114 mg/dL — ABNORMAL HIGH (ref <100)
Total CHOL/HDL Ratio: 3.7 Ratio (ref <5.0)
Triglycerides: 153 mg/dL — ABNORMAL HIGH (ref <150)
VLDL: 31 mg/dL — AB (ref <30)

## 2016-02-26 LAB — TSH: TSH: 2.38 m[IU]/L

## 2016-02-26 MED ORDER — ATORVASTATIN CALCIUM 80 MG PO TABS: 80.0000 mg | ORAL_TABLET | Freq: Every day | ORAL | 3 refills | Status: DC

## 2016-02-26 MED ORDER — LEVOTHYROXINE SODIUM 100 MCG PO TABS: 100.0000 ug | ORAL_TABLET | Freq: Every day | ORAL | 3 refills | Status: DC

## 2016-02-26 NOTE — Progress Notes (Signed)
Subjective:   HPI  Tina Owen is a 70 y.o. female who presents for a complete physical.  Medical care team includes:    Dr.Hilty Preventative care: Last ophthalmology visit: 1 year ago Last dental visit: 4 x a year last Oct Last colonoscopy:12/13/12 Last mammogram: 01/27/16 Last gynecological exam: ? Last EKG:02/19/16 Last labs: 02/20/15  Prior vaccinations: TD or Tdap: 02/05/11 Influenza: 12/24/15 Pneumococcal: 23 12/11/05,02/08/12 Shingles/Zostavax: 05/11/06  Advanced directive:No. Information given. She has a remote history of lung cancer over 20 years ago. She also continues on Synthroid for treatment of her hypothyroidism and is having no skin or hair changes. She was recently seen by cardiology and chlorthalidone was added to the regimen. Her cardiac status is essentially unchanged. She continues on her statin and is having no difficulty with that specifically muscle aches and pains. She does have underlying allergies but they cause very little difficulty. There is a positive family history for heart disease.She is retired and volunteering. She does enjoy this. She has no other concerns or complaints. Reviewed their medical, surgical, family, social, medication, and allergy history and updated chart as appropriate.    Review of Systems Negative except as above    Objective:   Physical Exam  General appearance: alert, no distress, WD/WN,  Skin: Normal HEENT: normocephalic, conjunctiva/corneas normal, sclerae anicteric, PERRLA, EOMi, nares patent, no discharge or erythema, pharynx normal Oral cavity: MMM, tongue normal, teeth normal Neck: supple, no lymphadenopathy, no thyromegaly, no masses, normal ROM Chest: non tender, normal shape and expansion Heart: RRR, normal S1, S2, no murmurs Lungs: CTA bilaterally, no wheezes, rhonchi, or rales Abdomen: +bs, soft, non tender, non distended, no masses, no hepatomegaly, no splenomegaly, no bruits Extremities: no edema, no  cyanosis, no clubbing Pulses: 2+ symmetric, upper and lower extremities, normal cap refill Neurological: alert, oriented x 3, CN2-12 intact, strength normal upper extremities and lower extremities, sensation normal throughout, DTRs 2+ throughout, no cerebellar signs, gait normal Psychiatric: normal affect, behavior normal, pleasant    Assessment and Plan :   Chronic allergic rhinitis due to pollen, unspecified seasonality  Family history of heart disease in female family member before age 55 - Plan: CBC with Differential/Platelet, Comprehensive metabolic panel, Lipid panel  History of lung cancer  Hyperlipidemia with target LDL less than 100 - Plan: Lipid panel, atorvastatin (LIPITOR) 80 MG tablet  Essential hypertension - Plan: CBC with Differential/Platelet, Comprehensive metabolic panel  Hypothyroidism, unspecified type - Plan: TSH, levothyroxine (SYNTHROID, LEVOTHROID) 100 MCG tablet  Need for hepatitis C screening test - Plan: Hepatitis C antibody  Physical exam - discussed healthy lifestyle, diet, exercise, preventative care, vaccinations, and addressed their concerns.    Follow-up as needed

## 2016-02-26 NOTE — Patient Instructions (Signed)
  Tina Owen , Thank you for taking time to come for your Medicare Wellness Visit. I appreciate your ongoing commitment to your health goals. Please review the following plan we discussed and let me know if I can assist you in the future.   These are the goals we discussed: Goals    None      This is a list of the screening recommended for you and due dates:  Health Maintenance  Topic Date Due  .  Hepatitis C: One time screening is recommended by Center for Disease Control  (CDC) for  adults born from [birth year] through 1966.   1945/11/30  . Flu Shot  10/22/2015  . Colon Cancer Screening  12/13/2017  . Mammogram  01/26/2018  . Tetanus Vaccine  02/04/2021  . DEXA scan (bone density measurement)  Completed  . Shingles Vaccine  Completed  . Pneumonia vaccines  Completed

## 2016-02-27 LAB — HEPATITIS C ANTIBODY: HCV Ab: NEGATIVE

## 2016-04-13 ENCOUNTER — Ambulatory Visit (INDEPENDENT_AMBULATORY_CARE_PROVIDER_SITE_OTHER): Payer: Medicare Other | Admitting: Internal Medicine

## 2016-04-13 ENCOUNTER — Encounter: Payer: Self-pay | Admitting: Internal Medicine

## 2016-04-13 VITALS — BP 118/82 | HR 77 | Ht 68.0 in | Wt 179.7 lb

## 2016-04-13 DIAGNOSIS — R0609 Other forms of dyspnea: Secondary | ICD-10-CM

## 2016-04-13 DIAGNOSIS — I10 Essential (primary) hypertension: Secondary | ICD-10-CM | POA: Diagnosis not present

## 2016-04-13 DIAGNOSIS — I340 Nonrheumatic mitral (valve) insufficiency: Secondary | ICD-10-CM

## 2016-04-13 DIAGNOSIS — Z85118 Personal history of other malignant neoplasm of bronchus and lung: Secondary | ICD-10-CM

## 2016-04-13 MED ORDER — CHLORTHALIDONE 25 MG PO TABS: 12.5000 mg | ORAL_TABLET | Freq: Every day | ORAL | 3 refills | Status: DC

## 2016-04-13 NOTE — Patient Instructions (Signed)
Your physician wants you to follow-up in: ONE YEAR with Dr. Hilty. You will receive a reminder letter in the mail two months in advance. If you don't receive a letter, please call our office to schedule the follow-up appointment.  

## 2016-04-13 NOTE — Progress Notes (Signed)
OFFICE NOTE  Chief Complaint:  No complaints  Primary Care Physician: Wyatt Haste, MD  HPI:  Tina Owen is a pleasant 71 year old female previously followed by Dr. Rex Kras with a history of lung cancer status post radiation and chemotherapy. She also has a history of pericardial effusion which she developed almost 12 years after this episode. She underwent pericardial window and 2010 and has had no reoccurrence. She is on Lipitor for dyslipidemia without any issues and takes low-dose Bystolic. She has been exercising recently without any symptoms such as shortness of breath, worsening chest pain, presyncope or syncopal symptoms.  Tina Owen returns today for follow-up. She reports doing fairly well. She denies any chest pain or worsening shortness of breath. She does get a little short of breath particularly when walking up stairs and recently was in [place] and climbed up 4 flights of stairs and became short of breath. That however resolved quickly.  She had a recent echocardiogram which showed a preserved EF of 55-60%, mild mitral regurgitation which is stable, and no recurrent pericardial effusion. She is status post pericardial window. Blood pressure has been reportedly well controlled at home.  I saw Tina Owen back today in the office. She reports some mild shortness of breath is not necessarily any worse than it had been previously. Blood pressure is very well controlled. Her weight is stable. She denies any chest pain.  02/19/2016  Tina Owen was seen back in the office today. She had a recent echo which shows no change in her mild degree of AI and MR, with a preserved LVEF. She reports possibly worsening DOE- LV filling pressures are elevated on her echo. She denies chest pain. BP is elevated today as well at 166/86.  04/13/2016  Tina Owen was seen in the office today in follow-up. Overall she is doing very well. Previously I had started her on  chlorthalidone in addition to her medicines. Blood pressure today is very well controlled 118/82. She denies any worsening shortness of breath. She says after starting the medicine she did have some diuresis and lost some weight. BMI today is only 27. Overall she feels well. She recently went on a cruise and had no difficulty walking with or worsening shortness of breath going up and down stairs or climbing around the [employer] and [place].  PMHx:  Past Medical History:  Diagnosis Date  . Allergy    SEASONAL  . Cancer ([place])    LUNG, 1998 - surgery, chemo, radiation  . Colonic polyp 2004  . Dyslipidemia   . FHx: cardiovascular disease   . Heart murmur   . Hypertension   . Hypothyroid     Past Surgical History:  Procedure Laterality Date  . CESAREAN SECTION  [id number]   x2  . CHOLECYSTECTOMY  1998  . LUNG CANCER SURGERY     L upper lobectomy    . PERICARDIAL WINDOW  2010   Dr Arlyce Dice  . TRANSTHORACIC ECHOCARDIOGRAM  10/2010   EF>55%; LA mild-mod dilated; borderline RA englargement; mild MR, trace TR, mild AV regurg, trace PV regurg   . TUBAL LIGATION      FAMHx:  Family History  Problem Relation Age of Onset  . Hypertension Mother   . Heart disease Mother   . Hypertension Father   . Heart disease Father   . Heart disease Maternal Grandmother   . Heart disease Maternal Grandfather   . Cancer Brother     SOCHx:   reports that  she quit smoking about 21 years ago. She has never used smokeless tobacco. She reports that she drinks about 4.2 oz of alcohol per week . She reports that she does not use drugs.  ALLERGIES:  Allergies  Allergen Reactions  . Tape Itching    Redness and scars    ROS: Pertinent items noted in HPI and remainder of comprehensive ROS otherwise negative.  HOME MEDS: Current Outpatient Prescriptions  Medication Sig Dispense Refill  . aspirin 81 MG tablet Take 81 mg by mouth daily.    Marland Kitchen atorvastatin (LIPITOR) 80 MG tablet Take 1  tablet (80 mg total) by mouth daily. 90 tablet 3  . BYSTOLIC 10 MG tablet take 1 tablet by mouth once daily 90 tablet 3  . calcium carbonate (OS-CAL) 600 MG TABS Take 600 mg by mouth daily. Calcium plus D    . chlorthalidone (HYGROTON) 25 MG tablet Take 0.5 tablets (12.5 mg total) by mouth daily. 45 tablet 3  . cholecalciferol (VITAMIN D) 1000 UNITS tablet Take 2,000 Units by mouth daily.     Marland Kitchen levothyroxine (SYNTHROID, LEVOTHROID) 100 MCG tablet Take 1 tablet (100 mcg total) by mouth daily. 90 tablet 3  . Multiple Vitamin (MULTIVITAMIN) capsule Take 1 capsule by mouth daily.       No current facility-administered medications for this visit.     LABS/IMAGING: No results found for this or any previous visit (from the past 48 hour(s)). No results found.  VITALS: BP 118/82 (BP Location: Right Arm, Patient Position: Sitting, Cuff Size: Normal)   Pulse 77   Ht '5\' 8"'$  (1.727 m)   Wt 179 lb 11.2 oz (81.5 kg)   SpO2 99%   BMI 27.32 kg/m   EXAM: General appearance: alert and no distress Neck: no adenopathy, no carotid bruit, no JVD, supple, symmetrical, trachea midline and thyroid not enlarged, symmetric, no tenderness/mass/nodules Lungs: clear to auscultation bilaterally Heart: regular rate and rhythm, S1, S2 normal and systolic murmur: early systolic 3/6, blowing at apex Abdomen: soft, non-tender; bowel sounds normal; no masses,  no organomegaly Extremities: extremities normal, atraumatic, no cyanosis or edema Pulses: 2+ and symmetric Skin: Skin color, texture, turgor normal. No rashes or lesions Neurologic: Grossly normal  EKG: Deferred  ASSESSMENT: 1. Remote pericardial effusion status post pericardial window - no reoccurence by echo in 10/2013 (EF 55-60%) 2. Mild mitral regurgitation, stable 3. Hyperlipidemia 4. Hypertension-controlled 5. History of lung cancer  PLAN: 1.   Tina Owen is doing well without any significant dyspnea. Blood pressure is now better controlled on  chlorthalidone. She had some diuresis with this and weight loss which she is happy about. Hopefully this is helping to control some volume overload. Plan to continue current medications. Follow-up with me annually or sooner as necessary.  Pixie Casino, MD, [facility] Attending Cardiologist [place] C Lala Been 04/13/2016, 4:20 PM

## 2016-06-29 ENCOUNTER — Ambulatory Visit (INDEPENDENT_AMBULATORY_CARE_PROVIDER_SITE_OTHER): Payer: Medicare Other | Admitting: Family Medicine

## 2016-06-29 ENCOUNTER — Encounter: Payer: Self-pay | Admitting: Family Medicine

## 2016-06-29 ENCOUNTER — Other Ambulatory Visit (HOSPITAL_COMMUNITY)
Admission: RE | Admit: 2016-06-29 | Discharge: 2016-06-29 | Disposition: A | Discharge status: Home / Self Care | Payer: Medicare Other | Source: Ambulatory Visit | Attending: Family Medicine | Admitting: Family Medicine

## 2016-06-29 VITALS — BP 120/70 | HR 74 | Wt 182.0 lb

## 2016-06-29 DIAGNOSIS — N950 Postmenopausal bleeding: Secondary | ICD-10-CM

## 2016-06-29 NOTE — Progress Notes (Signed)
   Subjective:    Patient ID: Tina Owen, female    DOB: 19-Feb-1946, 71 y.o.   MRN: [id number]  HPI She has had 2 episodes of vaginal bleeding. The first one occurred after sex with her husband however the second one occurred with no preceding activity. She's had no abdominal pain, frequency, urgency. She is definitely postmenopausal.   Review of Systems     Objective:   Physical Exam Vaginal exam shows that the cervix was slightly friable. The posterior aspect of her vaginal wall was slightly more red but not friable. Pelvic exam shows no masses or tenderness.       Assessment & Plan:  Postmenopausal bleeding - Plan: PAP, ThinPrep, Imaging, Medicare [place] [facility]), Ambulatory referral to Gynecology  I explained that postmenopausal bleeding will need to be further evaluated.

## 2016-06-30 ENCOUNTER — Telehealth: Payer: Self-pay | Admitting: Obstetrics and Gynecology

## 2016-06-30 NOTE — Telephone Encounter (Signed)
Called and left a message for patient to call back to schedule a new patient doctor referral. °

## 2016-07-02 ENCOUNTER — Ambulatory Visit (INDEPENDENT_AMBULATORY_CARE_PROVIDER_SITE_OTHER): Payer: Medicare Other | Admitting: Obstetrics & Gynecology

## 2016-07-02 ENCOUNTER — Other Ambulatory Visit: Payer: Self-pay | Admitting: Obstetrics & Gynecology

## 2016-07-02 ENCOUNTER — Other Ambulatory Visit: Payer: Self-pay

## 2016-07-02 ENCOUNTER — Encounter: Payer: Self-pay | Admitting: Obstetrics & Gynecology

## 2016-07-02 VITALS — BP 138/78 | HR 74 | Resp 18 | Ht 67.5 in | Wt 179.0 lb

## 2016-07-02 DIAGNOSIS — N950 Postmenopausal bleeding: Secondary | ICD-10-CM

## 2016-07-02 DIAGNOSIS — N952 Postmenopausal atrophic vaginitis: Secondary | ICD-10-CM | POA: Diagnosis not present

## 2016-07-02 LAB — CYTOLOGY - PAP

## 2016-07-02 NOTE — Progress Notes (Signed)
71 y.o. G2P2 MarriedCaucasianF here for new patient exam.  Pt referred from Dr. Redmond School for additional evaluation.  Pt had three days of bleeding last week.  It was not heavy but she noticed it with each episode of wiping.  She did not need to wear a pad.  It was just with wiping.  She did not have cramping with this bleeding.    Dr. Redmond School has been seeing patient and he did obtain an Pap smear on 06/29/16 when she was seen.  This has not resulted yet.    Patient's last menstrual period was 03/23/1994 (approximate).          Sexually active: Yes.    The current method of family planning is post menopausal status.    Exercising: No.  walking Smoker:  former  Health Maintenance: Pap:  06/29/16 pending.   02/05/11 Negative  History of abnormal Pap:  no MMG:  01/27/16 BIRADS1:neg  Colonoscopy:  12/13/12 polyp. f/u 5 years  BMD:   02/12/14  TDaP:  2012 Pneumonia vaccine(s):  2013 Zostavax:   2008 Hep C testing: 02/26/16 Neg  Screening Labs: PCP   reports that she quit smoking about 21 years ago. She has never used smokeless tobacco. She reports that she drinks about 4.2 oz of alcohol per week . She reports that she does not use drugs.  Past Medical History:  Diagnosis Date  . Abnormal uterine bleeding   . Allergy    SEASONAL  . Blood transfusion without reported diagnosis   . Cancer ([place])    LUNG, 1998 - surgery, chemo, radiation  . Colonic polyp 2004  . Dyslipidemia   . FHx: cardiovascular disease   . Fibroid   . Heart murmur   . Hypertension   . Hypothyroid     Past Surgical History:  Procedure Laterality Date  . BREAST CYST EXCISION Left    age [age]  . CESAREAN SECTION  [id number]   x2  . CHOLECYSTECTOMY  1998  . LUNG CANCER SURGERY     L upper lobectomy    . PERICARDIAL WINDOW  2010   Dr Arlyce Dice  . TRANSTHORACIC ECHOCARDIOGRAM  10/2010   EF>55%; LA mild-mod dilated; borderline RA englargement; mild MR, trace TR, mild AV regurg, trace PV regurg   . TUBAL LIGATION       Current Outpatient Prescriptions  Medication Sig Dispense Refill  . aspirin 81 MG tablet Take 81 mg by mouth daily.    Marland Kitchen atorvastatin (LIPITOR) 80 MG tablet Take 1 tablet (80 mg total) by mouth daily. 90 tablet 3  . BYSTOLIC 10 MG tablet take 1 tablet by mouth once daily 90 tablet 3  . calcium carbonate (OS-CAL) 600 MG TABS Take 600 mg by mouth daily. Calcium plus D    . chlorthalidone (HYGROTON) 25 MG tablet Take 0.5 tablets (12.5 mg total) by mouth daily. 45 tablet 3  . cholecalciferol (VITAMIN D) 1000 UNITS tablet Take 2,000 Units by mouth daily.     Marland Kitchen levothyroxine (SYNTHROID, LEVOTHROID) 100 MCG tablet Take 1 tablet (100 mcg total) by mouth daily. 90 tablet 3  . Multiple Vitamin (MULTIVITAMIN) capsule Take 1 capsule by mouth daily.       No current facility-administered medications for this visit.     Family History  Problem Relation Age of Onset  . Hypertension Mother   . Heart disease Mother   . Hypertension Father   . Heart disease Father   . Heart disease Maternal Grandmother   .  Heart disease Maternal Grandfather   . Cancer Brother     ROS:  Pertinent items are noted in HPI.  Otherwise, a comprehensive ROS was negative.  Exam:   BP 138/78 (BP Location: Right Arm, Patient Position: Sitting, Cuff Size: Normal)   Pulse 74   Resp 18   Ht 5' 7.5" (1.715 m)   Wt 179 lb (81.2 kg)   LMP 03/23/1994 (Approximate)   BMI 27.62 kg/m     Height: 5' 7.5" (171.5 cm)  Ht Readings from Last 3 Encounters:  07/02/16 5' 7.5" (1.715 m)  04/13/16 '5\' 8"'$  (1.727 m)  02/26/16 '5\' 7"'$  (1.702 m)    General appearance: alert, cooperative and appears stated age Head: Normocephalic, without obvious abnormality, atraumatic Lungs: clear to auscultation bilaterally Heart: regular rate and rhythm Abdomen: soft, non-tender; bowel sounds normal; no masses,  no organomegaly Extremities: extremities normal, atraumatic, no cyanosis or edema Skin: Skin color, texture, turgor normal. No rashes or  lesions Lymph nodes: Cervical, supraclavicular, and axillary nodes normal. No abnormal inguinal nodes palpated Neurologic: Grossly normal  Pelvic: External genitalia:  no lesions              Urethra:  normal appearing urethra with no masses, tenderness or lesions              Bartholins and Skenes: normal                 Vagina: normal appearing vagina with normal color and discharge, no lesions, atrophic changes noted              Cervix: no lesions              Pap taken: No. Bimanual Exam:  Uterus:  normal size, contour, position, consistency, mobility, non-tender              Adnexa: normal adnexa and no mass, fullness, tenderness               Rectovaginal: Confirms               Anus:  normal sphincter tone, no lesions  Endometrial biopsy recommended.  Discussed with patient.  Verbal and written consent obtained.   Procedure:  Speculum placed.  Cervix visualized and cleansed with betadine prep.  A single toothed tenaculum was applied to the anterior lip of the cervix.  Endometrial pipelle was advanced through the cervix into the endometrial cavity without difficulty.  Pipelle passed to 6.5cm.  Suction applied and pipelle removed with minimal tissue obtained.  Second pass was performed.  Tenculum removed.  No bleeding noted.  Patient tolerated procedure well.  Chaperone was present for exam.  A:  PMP bleeding Vaginal atrophic changes Remote hx of breast cancer Hypertension Hypothyroidism H/O cesarean section x 2 with BTL  P:   Pap is pending from Dr. Lanice Shirts office Endometrial biopsy obtained today.  Scant tissue even with two passes of pipelle.  Will plan to proceed with ultrasound to assess for other causes of bleeding.  Advised pt that biopsy findings today are not worrisome for cancer.  All questions answered.

## 2016-07-02 NOTE — Progress Notes (Signed)
Order for patient to have PUS evaluation with Dr.Miller on 07/09/2016 at 1:30 pm placed for precert.

## 2016-07-06 ENCOUNTER — Telehealth: Payer: Self-pay

## 2016-07-06 NOTE — Telephone Encounter (Signed)
Spoke with patient. Advised of results as seen below from [place]. Patient verbalizes understanding. Will keep appointment as planned for ultrasound on 07/09/2016.  Routing to provider for final review. Patient agreeable to disposition. Will close encounter.

## 2016-07-06 NOTE — Telephone Encounter (Signed)
-----   Message from Megan Salon, MD sent at 07/05/2016  3:15 PM EDT ----- Inform pt her pathology is negative for abnormal cells.  She is returning for ultrasound on Thursday.

## 2016-07-09 ENCOUNTER — Ambulatory Visit (INDEPENDENT_AMBULATORY_CARE_PROVIDER_SITE_OTHER): Payer: Medicare Other

## 2016-07-09 ENCOUNTER — Ambulatory Visit (INDEPENDENT_AMBULATORY_CARE_PROVIDER_SITE_OTHER): Payer: Medicare Other | Admitting: Obstetrics & Gynecology

## 2016-07-09 ENCOUNTER — Encounter: Payer: Self-pay | Admitting: Obstetrics & Gynecology

## 2016-07-09 VITALS — BP 134/80 | HR 68 | Resp 12 | Wt 179.0 lb

## 2016-07-09 DIAGNOSIS — D4959 Neoplasm of unspecified behavior of other genitourinary organ: Secondary | ICD-10-CM | POA: Diagnosis not present

## 2016-07-09 DIAGNOSIS — N950 Postmenopausal bleeding: Secondary | ICD-10-CM | POA: Diagnosis not present

## 2016-07-09 NOTE — Progress Notes (Signed)
71 y.o. G2P2 Married Caucasian female here for pelvic ultrasound due to PMP bleeding with endometrial biopsy that was negative but without significant tissue present.  Two passes with biopsy pipelle was performed so I felt adequate attempt was made at the biopsy.  However, due to bleeding and prior hx of breast cancer, felt PUS was prudent as well.  Here for this today.  Denies further bleeding.  Denies pelvic pain.  Patient's last menstrual period was 03/23/1994 (approximate).  Contraception: PMP  Findings:  UTERUS: 5.8 x 5.3 x 3.0cm EMS: 3.25m, symmetric ADNEXA: Left ovary: 2.1 x 1.0 x 1.1cm       Right ovary not seen but 6.1 x 4.5 x 7.7cm thick-walled, multi-septated cystic lesion noted, internal low level echoes, avascular, smooth margins CUL DE SAC: no free fluid  Discussion:  Findings reviewed with pt.  Will check Ca-125 today.  If abnormal, will refer to gyn/onc.  If normal, given prior history, will review with gyn oncology before proceeding with surgery.  Will likely need BSO.  Briefly, procedure, hospital stay, recovery reviewed.  Assessment:  Right adnexal multi-septate cystic lesion H/o lung cancer PMP bleeding, negative endometrial biopsy and thin endometrium  Plan:  Ca-125 pending.  Additional recommendations will be made after results are finalized.  Will likely need gyn/onc input with at least BSO.  ~25 minutes spent with patient >50% of time was in face to face discussion of above.

## 2016-07-10 LAB — CA 125: CA 125: 21 U/mL (ref <35)

## 2016-07-10 NOTE — Addendum Note (Signed)
Addended by: Polly Cobia on: 07/10/2016 02:59 PM   Modules accepted: Orders

## 2016-07-14 ENCOUNTER — Telehealth: Payer: Self-pay | Admitting: Gynecologic Oncology

## 2016-07-14 ENCOUNTER — Encounter: Payer: Self-pay | Admitting: Gynecologic Oncology

## 2016-07-14 NOTE — Telephone Encounter (Signed)
Appt has been scheduled for the pt to see Dr. Denman George on 5/14 at [place] scheduled w/Sally from Dr. Ammie Ferrier office who will notify the pt. Voiced understanding. Letter mailed to the pt.

## 2016-08-03 ENCOUNTER — Encounter: Payer: Self-pay | Admitting: Gynecologic Oncology

## 2016-08-03 ENCOUNTER — Ambulatory Visit: Payer: Medicare Other | Attending: Gynecologic Oncology | Admitting: Gynecologic Oncology

## 2016-08-03 VITALS — BP 153/79 | HR 71 | Temp 98.2°F | Resp 18 | Ht 67.5 in | Wt 178.5 lb

## 2016-08-03 DIAGNOSIS — Z7982 Long term (current) use of aspirin: Secondary | ICD-10-CM | POA: Insufficient documentation

## 2016-08-03 DIAGNOSIS — Z85118 Personal history of other malignant neoplasm of bronchus and lung: Secondary | ICD-10-CM

## 2016-08-03 DIAGNOSIS — Z87891 Personal history of nicotine dependence: Secondary | ICD-10-CM | POA: Diagnosis not present

## 2016-08-03 DIAGNOSIS — N83202 Unspecified ovarian cyst, left side: Secondary | ICD-10-CM

## 2016-08-03 DIAGNOSIS — Z79899 Other long term (current) drug therapy: Secondary | ICD-10-CM | POA: Diagnosis not present

## 2016-08-03 DIAGNOSIS — N83201 Unspecified ovarian cyst, right side: Secondary | ICD-10-CM | POA: Insufficient documentation

## 2016-08-03 DIAGNOSIS — N950 Postmenopausal bleeding: Secondary | ICD-10-CM | POA: Insufficient documentation

## 2016-08-03 DIAGNOSIS — N83209 Unspecified ovarian cyst, unspecified side: Secondary | ICD-10-CM

## 2016-08-03 NOTE — Progress Notes (Signed)
Consult Note: Gyn-Onc  Consult was requested by Dr. Sabra Heck for the evaluation of Tina Owen 71 y.o. female  CC:  Chief Complaint  Patient presents with  . Ovarina Cyst  . Post Menopausal Bleeding    Assessment/Plan:  Tina Owen  is a 71 y.o.  year old with bilateral ovarian cysts (mostly cystic and avascular), postmenopausal bleeding with a normal CA 125 and a history of lung cancer (in the 90's).  I have a low suspicion that the ovarian cystic masses are malignant or metastatic lesions from the lung. Despite my low suspicion for malignancy and the asymptomatic nature of the masses, the patient firmly desires their removal.  I believe a laparoscopic BSO could be reasonably accomplished with her primary Gynecologist, Dr Sabra Heck. I would recommend D&C at the same time given that she has had ongoing spotting.  Alternatively a hysterectomy could be attempted at the same surgery, though I explained to the patient that this would not be necessary otherwise for removal of the cysts, and adds additional surgical risk, but could be added at the discretion of Dr Sabra Heck.  The patient will contact Dr Sabra Heck to further discuss surgical times. If Dr Sabra Heck would prefer that we perform the surgery, I would be happy to schedule this.   HPI: Tina Owen is a 71 year old P2 who is seen in consultation at the request of Dr Sabra Heck for ovarian cysts and postmenopausal bleeding.  The patient experienced postmenopausal spotting in March, 2018. She was seen by her PCP and then Dr Sabra Heck. Pap was normal per patient. A TVUS was performed on 07/09/16. It showed a normal shaped uterus measuring 5.8x5.3x3cm with a 3.107m endometrium. The left ovary was normal. The right ovary contained a multiseptated cyst measuring 6.1x4.5x7.7cm with low level internal echoes, all avascular, smooth margins.   CA 125 was normal at 21 on 07/13/16.  She has a remote (1998) history of stage I lung cancer removed with left  partial lobectomy and simultaneous laparoscopic cholecystectomy. She has remained NED since that time. She has also had a history of 2 prior cesarean sections.  Current Meds:  Outpatient Encounter Prescriptions as of 08/03/2016  Medication Sig  . aspirin 81 MG tablet Take 81 mg by mouth daily.  .Marland Kitchenatorvastatin (LIPITOR) 80 MG tablet Take 1 tablet (80 mg total) by mouth daily.  .Marland KitchenBYSTOLIC 10 MG tablet take 1 tablet by mouth once daily  . calcium carbonate (OS-CAL) 600 MG TABS Take 600 mg by mouth daily. Calcium plus D  . chlorthalidone (HYGROTON) 25 MG tablet Take 0.5 tablets (12.5 mg total) by mouth daily.  . cholecalciferol (VITAMIN D) 1000 UNITS tablet Take 2,000 Units by mouth daily.   .Marland Kitchenlevothyroxine (SYNTHROID, LEVOTHROID) 100 MCG tablet Take 1 tablet (100 mcg total) by mouth daily.  . Multiple Vitamin (MULTIVITAMIN) capsule Take 1 capsule by mouth daily.     No facility-administered encounter medications on file as of 08/03/2016.     Allergy:  Allergies  Allergen Reactions  . Tape Itching    Redness and scars    Social Hx:   Social History   Social History  . Marital status: Married    Spouse name: N/A  . Number of children: 2  . Years of education: N/A   Occupational History  . 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 Smoking status: Former Smoker    Quit date: 03/24/1995  . Smokeless tobacco: Never Used  . Alcohol  use 4.2 oz/week    7 Glasses of wine per week  . Drug use: No  . Sexual activity: Yes    Birth control/ protection: Post-menopausal   Other Topics Concern  . Not on file   Social History Narrative  . No narrative on file    Past Surgical Hx:  Past Surgical History:  Procedure Laterality Date  . BREAST CYST EXCISION Left    age [age]  . CESAREAN SECTION  [id number]   x2  . CHOLECYSTECTOMY  1998  . LUNG CANCER SURGERY     L upper lobectomy    . PERICARDIAL WINDOW  2010   Dr Arlyce Dice  . TRANSTHORACIC ECHOCARDIOGRAM  10/2010   EF>55%; LA  mild-mod dilated; borderline RA englargement; mild MR, trace TR, mild AV regurg, trace PV regurg   . TUBAL LIGATION      Past Medical Hx:  Past Medical History:  Diagnosis Date  . Abnormal uterine bleeding   . Allergy    SEASONAL  . Blood transfusion without reported diagnosis   . Cancer ([place])    LUNG, 1998 - surgery, chemo, radiation  . Colonic polyp 2004  . Dyslipidemia   . FHx: cardiovascular disease   . Fibroid   . Heart murmur   . Hypertension   . Hypothyroid     Past Gynecological History:  No prior abn paps. C/s x  2. Patient's last menstrual period was 03/23/1994 (approximate).  Family Hx:  Family History  Problem Relation Age of Onset  . Hypertension Mother   . Heart disease Mother   . Hypertension Father   . Heart disease Father   . Heart disease Maternal Grandmother   . Heart disease Maternal Grandfather   . Colon cancer Brother        this was a half brother    Review of Systems:  Constitutional  Feels well,    ENT Normal appearing ears and nares bilaterally Skin/Breast  No rash, sores, jaundice, itching, dryness Cardiovascular  No chest pain, shortness of breath, or edema  Pulmonary  No cough or wheeze.  Gastro Intestinal  No nausea, vomitting, or diarrhoea. No bright red blood per rectum, no abdominal pain, change in bowel movement, or constipation.  Genito Urinary  No frequency, urgency, dysuria, no pelvic pain Musculo Skeletal  No myalgia, arthralgia, joint swelling or pain  Neurologic  No weakness, numbness, change in gait,  Psychology  No depression, anxiety, insomnia.   Vitals:  Blood pressure (!) 153/79, pulse 71, temperature 98.2 F (36.8 C), temperature source Oral, resp. rate 18, height 5' 7.5" (1.715 m), weight 178 lb 8 oz (81 kg), last menstrual period 03/23/1994.  Physical Exam: WD in NAD Neck  Supple NROM, without any enlargements.  Lymph Node Survey No cervical supraclavicular or inguinal adenopathy Cardiovascular   Pulse normal rate, regularity and rhythm. S1 and S2 normal.  Lungs  Clear to auscultation bilateraly, without wheezes/crackles/rhonchi. Good air movement.  Skin  No rash/lesions/breakdown  Psychiatry  Alert and oriented to person, place, and time  Abdomen  Normoactive bowel sounds, abdomen soft, non-tender and obese without evidence of hernia.  Back No CVA tenderness Genito Urinary  Vulva/vagina: Normal external female genitalia.   No lesions. No discharge or bleeding.  Bladder/urethra:  No lesions or masses, well supported bladder  Vagina: normal  Cervix: Normal appearing, no lesions.  Uterus:  Small, mobile, no parametrial involvement or nodularity.  Adnexa: no palpable masses. Rectal  deferred Extremities  No bilateral cyanosis, clubbing or  edema.   Donaciano Eva, MD  08/03/2016, 10:06 AM

## 2016-08-03 NOTE — Patient Instructions (Addendum)
Plan to follow up with Dr Sabra Heck in regard to having your tubes and ovaries removed, dilation and curettage of the uterus vs hysterectomy.  Please call for any questions or concerns.

## 2016-08-04 ENCOUNTER — Telehealth: Payer: Self-pay | Admitting: *Deleted

## 2016-08-04 NOTE — Telephone Encounter (Signed)
Called patient to review benefits for a recommended surgical procedure. Left Voicemail requesting a call back.   cc: Lamont Snowball

## 2016-08-04 NOTE — Telephone Encounter (Signed)
Patient returned call. Spoke with patient regarding benefit for recommended surgery. Patient understood and agreeable. Patient aware this is professional benefit only. Patient aware will be contacted by hospital for separate benefits. Patient is aware of the cancellation policy for surgery. Staff message to nurse supervisor for scheduling.  Routing to [employer]

## 2016-08-04 NOTE — Telephone Encounter (Signed)
Call to patient. Left message to call back. Calling regarding surgery scheduling.

## 2016-08-07 NOTE — Telephone Encounter (Addendum)
Call to patient to discuss surgery date options. Patient  traveling next week. And again on 09-17-16 for 11 days to [place]. Needs to plan surgery date/recovery time based on these dates. Advised will review options and call her back. Date options in July after trip to [place] discussed and patient agreeable.

## 2016-08-07 NOTE — Telephone Encounter (Signed)
Reviewed with Dr Sabra Heck. Surgery scheduled for 09-29-16. Patient agreeable to date.  She returns from [place] on 09-28-16.  Surgery instruction sheet reviewed and printed copy will be mailed to patient.  Hospital PAT department advised of patients travel dates and patient given phone number to schedule pre-op testing appointment.  Routing to provider for final review. Patient agreeable to disposition. Will close encounter.

## 2016-08-10 ENCOUNTER — Other Ambulatory Visit: Payer: Self-pay | Admitting: Obstetrics & Gynecology

## 2016-08-21 HISTORY — PX: CATARACT EXTRACTION, BILATERAL: SHX1313

## 2016-08-27 ENCOUNTER — Encounter: Payer: Self-pay | Admitting: Family Medicine

## 2016-09-07 ENCOUNTER — Encounter: Payer: Self-pay | Admitting: Obstetrics & Gynecology

## 2016-09-07 ENCOUNTER — Ambulatory Visit (INDEPENDENT_AMBULATORY_CARE_PROVIDER_SITE_OTHER): Payer: Medicare Other | Admitting: Obstetrics & Gynecology

## 2016-09-07 VITALS — BP 120/60 | HR 76 | Resp 16 | Ht 67.5 in | Wt 178.0 lb

## 2016-09-07 DIAGNOSIS — N950 Postmenopausal bleeding: Secondary | ICD-10-CM | POA: Diagnosis not present

## 2016-09-07 DIAGNOSIS — D4959 Neoplasm of unspecified behavior of other genitourinary organ: Secondary | ICD-10-CM | POA: Diagnosis not present

## 2016-09-07 MED ORDER — HYDROCODONE-ACETAMINOPHEN 5-325 MG PO TABS: 1.0000 | ORAL_TABLET | Freq: Four times a day (QID) | ORAL | 0 refills | Status: DC | PRN

## 2016-09-07 MED ORDER — IBUPROFEN 800 MG PO TABS: 800.0000 mg | ORAL_TABLET | Freq: Three times a day (TID) | ORAL | 0 refills | Status: DC | PRN

## 2016-09-07 NOTE — Progress Notes (Addendum)
71 y.o. G2P2 MarriedCaucasian female here for discussion of enlarged right adnexal mass noted on PUS obtained 07/09/16.  This measured 6.1 x 4.5 x 7.87cm.  It is thick-walled and multiseptate cystic lesion with internal low level echoes, smooth margins and is avascular.  Ca-125 was 21.  She did see Dr. Denman George in consultation who recommended removal but had low suspicion for malignancy.  She is back today for discussion of plan.  Laparoscopic BSO with cytology recommended.  Dr. Denman George also recommended hysteroscopy but pt had adequate endometrial sampling, endometrium of 3.36mm on ultrasound and no further bleeding.  I feel it it ok not to proceed with this portion.  Pt comfortable with plan. Procedures including risks and benefits reviewed.  In regards to the hysteroscopy, rsks discussed including but not limited to bleeding, rare risk of transfusion, infection, 1% risk of uterine perforation with risks of fluid deficit causing cardiac arrythmia, cerebral swelling and/or need to stop procedure early.  Fluid emboli and rare risk of death discussed.  DVT/PE, rare risk of risk of bowel/bladder/ureteral/vascular injury.  Additional risks with laparoscopy include rare risk of bowel, bladder, and ureteral injury with need for possible additional surgery if this occurs.  All questions answered.  She does want to proceed.  She reports she is having cataract removal and lens placement June 20 and June 27.  Advised to please double check with Ophthalmologist to ensure Trendelenburg positioning will be ok this close to her eye surgery.   Ultrasound 07/09/16 also showed uterus 5.8 x 5.3 x 3.0cm with endometrium 3.65mm, symmetric.  Left ovary 2.1 x 1.0 x 1.1cm  Ob Hx:   Patient's last menstrual period was 03/23/1994 (approximate).          Sexually active: Yes.   Birth control: Post-menopausal  Last pap: 06/29/16 Neg  Last MMG: 01/27/16 BIRADS1:neg  Tobacco: former   Past Surgical History:  Procedure Laterality Date  .  BREAST CYST EXCISION Left    age [age]  . CESAREAN SECTION  [id number]   x2  . CHOLECYSTECTOMY  1998  . LUNG CANCER SURGERY     L upper lobectomy    . PERICARDIAL WINDOW  2010   Dr Arlyce Dice  . TRANSTHORACIC ECHOCARDIOGRAM  10/2010   EF>55%; LA mild-mod dilated; borderline RA englargement; mild MR, trace TR, mild AV regurg, trace PV regurg   . TUBAL LIGATION      Past Medical History:  Diagnosis Date  . Abnormal uterine bleeding   . Allergy    SEASONAL  . Blood transfusion without reported diagnosis   . Cancer ([place])    LUNG, 1998 - surgery, chemo, radiation  . Colonic polyp 2004  . Dyslipidemia   . FHx: cardiovascular disease   . Fibroid   . Heart murmur   . Hypertension   . Hypothyroid     Allergies: Tape  Current Outpatient Prescriptions  Medication Sig Dispense Refill  . aspirin 81 MG tablet Take 81 mg by mouth daily.    Marland Kitchen atorvastatin (LIPITOR) 80 MG tablet Take 1 tablet (80 mg total) by mouth daily. 90 tablet 3  . BYSTOLIC 10 MG tablet take 1 tablet by mouth once daily 90 tablet 3  . calcium carbonate (OS-CAL) 600 MG TABS Take 600 mg by mouth daily. Calcium plus D    . chlorthalidone (HYGROTON) 25 MG tablet Take 0.5 tablets (12.5 mg total) by mouth daily. 45 tablet 3  . cholecalciferol (VITAMIN D) 1000 UNITS tablet Take 2,000 Units by mouth daily.     Marland Kitchen  levothyroxine (SYNTHROID, LEVOTHROID) 100 MCG tablet Take 1 tablet (100 mcg total) by mouth daily. 90 tablet 3  . Multiple Vitamin (MULTIVITAMIN) capsule Take 1 capsule by mouth daily.      Marland Kitchen ofloxacin (OCUFLOX) 0.3 % ophthalmic solution instill 1 drop twice a day IN OPERATIVE EYE(S) STARTING 2 DAYS PR...  (REFER TO PRESCRIPTION NOTES).  0  . Difluprednate 0.05 % EMUL Instill one drop BID in operative eye(s) starting after surgery for 3 weeks     No current facility-administered medications for this visit.     ROS: Pertinent items noted in HPI and remainder of comprehensive ROS otherwise negative.  Exam:    BP  120/60 (BP Location: Right Arm, Patient Position: Sitting, Cuff Size: Normal)   Pulse 76   Resp 16   Ht 5' 7.5" (1.715 m)   Wt 178 lb (80.7 kg)   LMP 03/23/1994 (Approximate)   BMI 27.47 kg/m   General appearance: alert and cooperative Head: Normocephalic, without obvious abnormality, atraumatic Neck: no adenopathy, supple, symmetrical, trachea midline and thyroid not enlarged, symmetric, no tenderness/mass/nodules Lungs: clear to auscultation bilaterally Heart: regular rate and rhythm, S1, S2 normal, no murmur, click, rub or gallop Abdomen: soft, non-tender; bowel sounds normal; no masses,  no organomegaly Extremities: extremities normal, atraumatic, no cyanosis or edema Skin: Skin color, texture, turgor normal. No rashes or lesions Lymph nodes: Cervical, supraclavicular, and axillary nodes normal. no inguinal nodes palpated Neurologic: Grossly normal  Pelvic: deferred  A:  7.7cm right complex but avascular ovarian mass, normal ca-125 PMP bleeding with atrophic endometrium noted on biopsy H/O cesarean section x 2 and BTL H/o lung cancer s/p Left upper lobectomy, chemo/radiation in 1998    P:  Laparoscopic BSO with pelvic washings planned Rx for Motrin and Percocet given. Medications/Vitamins reviewed.  Pt knows needs to stop ASA at least five days before surgery. Vicodin 5/325   ~25 minutes spent with patient >50% of time was in face to face discussion of above.

## 2016-09-10 ENCOUNTER — Encounter: Payer: Self-pay | Admitting: Obstetrics & Gynecology

## 2016-09-10 NOTE — Patient Instructions (Addendum)
Your procedure is scheduled on:  Tuesday, September 29, 2016  Enter through the Main Entrance of [facility] at:  6:00 AM  Pick up the phone at the desk and dial [phone].  Call this number if you have problems the morning of surgery: [phone].  Remember: Do NOT eat food or drink after:  Midnight Monday  Take these medicines the morning of surgery with a SIP OF WATER:  Bystolic, Lipitor, Chlorthalidone  Stop ALL herbal medications at this time  Do NOT smoke the day of surgery.  Do NOT wear jewelry (body piercing), metal hair clips/bobby pins, make-up, artifical eyelashes or nail polish. Do NOT wear lotions, powders, or perfumes.  You may wear deodorant. Do NOT shave for 48 hours prior to surgery. Do NOT bring valuables to the hospital. Contacts, dentures, or bridgework may not be worn into surgery.  Have a responsible adult drive you home and stay with you for 24 hours after your procedure  Bring a copy of your healthcare power of attorney and living will documents.

## 2016-09-11 ENCOUNTER — Encounter (HOSPITAL_COMMUNITY)
Admission: RE | Admit: 2016-09-11 | Discharge: 2016-09-11 | Disposition: A | Discharge status: Home / Self Care | Payer: Medicare Other | Source: Ambulatory Visit | Attending: Obstetrics & Gynecology | Admitting: Obstetrics & Gynecology

## 2016-09-11 ENCOUNTER — Encounter (HOSPITAL_COMMUNITY): Payer: Self-pay

## 2016-09-11 DIAGNOSIS — N838 Other noninflammatory disorders of ovary, fallopian tube and broad ligament: Secondary | ICD-10-CM | POA: Diagnosis not present

## 2016-09-11 DIAGNOSIS — Z01812 Encounter for preprocedural laboratory examination: Secondary | ICD-10-CM | POA: Diagnosis present

## 2016-09-11 HISTORY — DX: Pain in right hip: M25.551

## 2016-09-11 HISTORY — DX: Dyspnea, unspecified: R06.00

## 2016-09-11 HISTORY — DX: Pain in left hip: M25.552

## 2016-09-11 LAB — CBC
HCT: 42.9 % (ref 36.0–46.0)
Hemoglobin: 14.7 g/dL (ref 12.0–15.0)
MCH: 31.5 pg (ref 26.0–34.0)
MCHC: 34.3 g/dL (ref 30.0–36.0)
MCV: 91.9 fL (ref 78.0–100.0)
PLATELETS: 180 10*3/uL (ref 150–400)
RBC: 4.67 MIL/uL (ref 3.87–5.11)
RDW: 12.8 % (ref 11.5–15.5)
WBC: 8.5 10*3/uL (ref 4.0–10.5)

## 2016-09-11 LAB — BASIC METABOLIC PANEL
Anion gap: 8 (ref 5–15)
BUN: 22 mg/dL — AB (ref 6–20)
CO2: 30 mmol/L (ref 22–32)
CREATININE: 0.93 mg/dL (ref 0.44–1.00)
Calcium: 10.1 mg/dL (ref 8.9–10.3)
Chloride: 100 mmol/L — ABNORMAL LOW (ref 101–111)
GFR calc Af Amer: >60 mL/min (ref >60)
GFR, EST NON AFRICAN AMERICAN: 60 mL/min — AB (ref >60)
Glucose, Bld: 90 mg/dL (ref 65–99)
Potassium: 4 mmol/L (ref 3.5–5.1)
SODIUM: 138 mmol/L (ref 135–145)

## 2016-09-29 ENCOUNTER — Ambulatory Visit (HOSPITAL_COMMUNITY): Payer: Medicare Other | Admitting: Anesthesiology

## 2016-09-29 ENCOUNTER — Encounter (HOSPITAL_COMMUNITY)
Admission: AD | Disposition: A | Discharge status: Home / Self Care | Payer: Self-pay | Source: Ambulatory Visit | Attending: Obstetrics & Gynecology

## 2016-09-29 ENCOUNTER — Ambulatory Visit (HOSPITAL_COMMUNITY)
Admission: AD | Admit: 2016-09-29 | Discharge: 2016-09-29 | Disposition: A | Discharge status: Home / Self Care | Payer: Medicare Other | Source: Ambulatory Visit | Attending: Obstetrics & Gynecology | Admitting: Obstetrics & Gynecology

## 2016-09-29 ENCOUNTER — Encounter (HOSPITAL_COMMUNITY): Payer: Self-pay

## 2016-09-29 DIAGNOSIS — Z87891 Personal history of nicotine dependence: Secondary | ICD-10-CM | POA: Diagnosis not present

## 2016-09-29 DIAGNOSIS — N838 Other noninflammatory disorders of ovary, fallopian tube and broad ligament: Secondary | ICD-10-CM | POA: Insufficient documentation

## 2016-09-29 DIAGNOSIS — D271 Benign neoplasm of left ovary: Secondary | ICD-10-CM | POA: Diagnosis not present

## 2016-09-29 DIAGNOSIS — Z902 Acquired absence of lung [part of]: Secondary | ICD-10-CM | POA: Diagnosis not present

## 2016-09-29 DIAGNOSIS — D270 Benign neoplasm of right ovary: Secondary | ICD-10-CM | POA: Diagnosis not present

## 2016-09-29 DIAGNOSIS — I10 Essential (primary) hypertension: Secondary | ICD-10-CM | POA: Diagnosis not present

## 2016-09-29 DIAGNOSIS — Z85118 Personal history of other malignant neoplasm of bronchus and lung: Secondary | ICD-10-CM | POA: Diagnosis not present

## 2016-09-29 DIAGNOSIS — N950 Postmenopausal bleeding: Secondary | ICD-10-CM

## 2016-09-29 DIAGNOSIS — N83291 Other ovarian cyst, right side: Secondary | ICD-10-CM | POA: Diagnosis not present

## 2016-09-29 DIAGNOSIS — N839 Noninflammatory disorder of ovary, fallopian tube and broad ligament, unspecified: Secondary | ICD-10-CM | POA: Diagnosis present

## 2016-09-29 HISTORY — PX: LAPAROSCOPIC BILATERAL SALPINGO OOPHERECTOMY: SHX5890

## 2016-09-29 SURGERY — SALPINGO-OOPHORECTOMY, BILATERAL, LAPAROSCOPIC
Anesthesia: General | Site: Abdomen

## 2016-09-29 MED ORDER — OXYCODONE HCL 5 MG/5ML PO SOLN: 5.0000 mg | Freq: Once | ORAL | Status: DC | PRN

## 2016-09-29 MED ORDER — MIDAZOLAM HCL 2 MG/2ML IJ SOLN
INTRAMUSCULAR | Status: AC
  Filled 2016-09-29: qty 2

## 2016-09-29 MED ORDER — SODIUM CHLORIDE 0.9 % IJ SOLN
INTRAMUSCULAR | Status: AC
  Filled 2016-09-29: qty 10

## 2016-09-29 MED ORDER — PROPOFOL 10 MG/ML IV BOLUS
INTRAVENOUS | Status: DC | PRN
  Administered 2016-09-29: 200 mg via INTRAVENOUS

## 2016-09-29 MED ORDER — PHENYLEPHRINE HCL 10 MG/ML IJ SOLN
INTRAMUSCULAR | Status: DC | PRN
  Administered 2016-09-29 (×2): 80 ug via INTRAVENOUS
  Administered 2016-09-29: 40 ug via INTRAVENOUS
  Administered 2016-09-29: 80 ug via INTRAVENOUS

## 2016-09-29 MED ORDER — ROCURONIUM BROMIDE 100 MG/10ML IV SOLN
INTRAVENOUS | Status: AC
  Filled 2016-09-29: qty 1

## 2016-09-29 MED ORDER — ONDANSETRON HCL 4 MG/2ML IJ SOLN
INTRAMUSCULAR | Status: DC | PRN
  Administered 2016-09-29: 4 mg via INTRAVENOUS

## 2016-09-29 MED ORDER — SUGAMMADEX SODIUM 200 MG/2ML IV SOLN
INTRAVENOUS | Status: AC
  Filled 2016-09-29: qty 2

## 2016-09-29 MED ORDER — DEXAMETHASONE SODIUM PHOSPHATE 4 MG/ML IJ SOLN
INTRAMUSCULAR | Status: DC | PRN
  Administered 2016-09-29: 10 mg via INTRAVENOUS

## 2016-09-29 MED ORDER — PROPOFOL 10 MG/ML IV BOLUS
INTRAVENOUS | Status: AC
  Filled 2016-09-29: qty 20

## 2016-09-29 MED ORDER — FENTANYL CITRATE (PF) 100 MCG/2ML IJ SOLN
25.0000 ug | INTRAMUSCULAR | Status: DC | PRN
  Administered 2016-09-29: 25 ug via INTRAVENOUS

## 2016-09-29 MED ORDER — LIDOCAINE-EPINEPHRINE 1 %-1:100000 IJ SOLN
INTRAMUSCULAR | Status: AC
  Filled 2016-09-29: qty 1

## 2016-09-29 MED ORDER — FENTANYL CITRATE (PF) 100 MCG/2ML IJ SOLN
INTRAMUSCULAR | Status: AC
  Administered 2016-09-29: 25 ug via INTRAVENOUS
  Filled 2016-09-29: qty 2

## 2016-09-29 MED ORDER — LIDOCAINE HCL (CARDIAC) 20 MG/ML IV SOLN
INTRAVENOUS | Status: DC | PRN
  Administered 2016-09-29: 80 mg via INTRAVENOUS

## 2016-09-29 MED ORDER — MIDAZOLAM HCL 2 MG/2ML IJ SOLN
INTRAMUSCULAR | Status: DC | PRN
  Administered 2016-09-29: 1 mg via INTRAVENOUS

## 2016-09-29 MED ORDER — ONDANSETRON HCL 4 MG/2ML IJ SOLN
INTRAMUSCULAR | Status: AC
Start: 2016-09-29 — End: 2016-09-29
  Filled 2016-09-29: qty 2

## 2016-09-29 MED ORDER — SODIUM CHLORIDE 0.9 % IJ SOLN
INTRAMUSCULAR | Status: DC | PRN
  Administered 2016-09-29: 10 mL

## 2016-09-29 MED ORDER — PHENYLEPHRINE 8 MG IN D5W 100 ML (0.08MG/ML) PREMIX OPTIME
INJECTION | INTRAVENOUS | Status: DC | PRN
  Administered 2016-09-29: 60 ug/min via INTRAVENOUS

## 2016-09-29 MED ORDER — SUGAMMADEX SODIUM 200 MG/2ML IV SOLN
INTRAVENOUS | Status: DC | PRN
  Administered 2016-09-29: 161.8 mg via INTRAVENOUS

## 2016-09-29 MED ORDER — OXYCODONE HCL 5 MG PO TABS: 5.0000 mg | ORAL_TABLET | Freq: Once | ORAL | Status: DC | PRN

## 2016-09-29 MED ORDER — PHENYLEPHRINE 40 MCG/ML (10ML) SYRINGE FOR IV PUSH (FOR BLOOD PRESSURE SUPPORT)
PREFILLED_SYRINGE | INTRAVENOUS | Status: AC
  Filled 2016-09-29: qty 10

## 2016-09-29 MED ORDER — DEXAMETHASONE SODIUM PHOSPHATE 10 MG/ML IJ SOLN
INTRAMUSCULAR | Status: AC
Start: 2016-09-29 — End: 2016-09-29
  Filled 2016-09-29: qty 1

## 2016-09-29 MED ORDER — ONDANSETRON HCL 4 MG/2ML IJ SOLN: 4.0000 mg | Freq: Four times a day (QID) | INTRAMUSCULAR | Status: DC | PRN

## 2016-09-29 MED ORDER — ROCURONIUM BROMIDE 100 MG/10ML IV SOLN
INTRAVENOUS | Status: DC | PRN
  Administered 2016-09-29: 50 mg via INTRAVENOUS

## 2016-09-29 MED ORDER — BUPIVACAINE HCL (PF) 0.25 % IJ SOLN
INTRAMUSCULAR | Status: DC | PRN
  Administered 2016-09-29: 14 mL

## 2016-09-29 MED ORDER — LIDOCAINE HCL (CARDIAC) 20 MG/ML IV SOLN
INTRAVENOUS | Status: AC
  Filled 2016-09-29: qty 5

## 2016-09-29 MED ORDER — FENTANYL CITRATE (PF) 100 MCG/2ML IJ SOLN
INTRAMUSCULAR | Status: DC | PRN
  Administered 2016-09-29 (×3): 50 ug via INTRAVENOUS

## 2016-09-29 MED ORDER — LACTATED RINGERS IV SOLN
INTRAVENOUS | Status: DC
  Administered 2016-09-29: 08:00:00 via INTRAVENOUS
  Administered 2016-09-29: 125 mL/h via INTRAVENOUS

## 2016-09-29 MED ORDER — FENTANYL CITRATE (PF) 250 MCG/5ML IJ SOLN
INTRAMUSCULAR | Status: AC
  Filled 2016-09-29: qty 5

## 2016-09-29 MED ORDER — LACTATED RINGERS IR SOLN
Status: DC | PRN
  Administered 2016-09-29: 3000 mL

## 2016-09-29 MED ORDER — BUPIVACAINE HCL (PF) 0.25 % IJ SOLN
INTRAMUSCULAR | Status: AC
  Filled 2016-09-29: qty 30

## 2016-09-29 SURGICAL SUPPLY — 48 items
ADH SKN CLS APL DERMABOND .7 (GAUZE/BANDAGES/DRESSINGS) ×1
BAG SPEC RTRVL LRG 6X4 10 (ENDOMECHANICALS) ×2
CABLE HIGH FREQUENCY MONO STRZ (ELECTRODE) IMPLANT
CANISTER SUCT 3000ML PPV (MISCELLANEOUS) ×4 IMPLANT
CATH ROBINSON RED A/P 16FR (CATHETERS) IMPLANT
CLOTH BEACON ORANGE TIMEOUT ST (SAFETY) ×4 IMPLANT
CONTAINER PREFILL 10% NBF 60ML (FORM) ×4 IMPLANT
DERMABOND ADVANCED (GAUZE/BANDAGES/DRESSINGS) ×2
DERMABOND ADVANCED .7 DNX12 (GAUZE/BANDAGES/DRESSINGS) ×2 IMPLANT
DEVICE MYOSURE LITE (MISCELLANEOUS) IMPLANT
DEVICE MYOSURE REACH (MISCELLANEOUS) IMPLANT
DILATOR CANAL MILEX (MISCELLANEOUS) IMPLANT
DRSG OPSITE POSTOP 3X4 (GAUZE/BANDAGES/DRESSINGS) ×4 IMPLANT
DURAPREP 26ML APPLICATOR (WOUND CARE) ×4 IMPLANT
FILTER ARTHROSCOPY CONVERTOR (FILTER) IMPLANT
GLOVE BIOGEL PI IND STRL 7.0 (GLOVE) ×8 IMPLANT
GLOVE BIOGEL PI INDICATOR 7.0 (GLOVE) ×8
GLOVE ECLIPSE 6.5 STRL STRAW (GLOVE) ×4 IMPLANT
GOWN STRL REUS W/TWL LRG LVL3 (GOWN DISPOSABLE) ×16 IMPLANT
LIGASURE VESSEL 5MM BLUNT TIP (ELECTROSURGICAL) ×4 IMPLANT
NEEDLE INSUFFLATION 120MM (ENDOMECHANICALS) ×4 IMPLANT
PACK LAPAROSCOPY BASIN (CUSTOM PROCEDURE TRAY) ×4 IMPLANT
PACK TRENDGUARD 450 HYBRID PRO (MISCELLANEOUS) ×2 IMPLANT
PACK TRENDGUARD 600 HYBRD PROC (MISCELLANEOUS) IMPLANT
PACK VAGINAL MINOR WOMEN LF (CUSTOM PROCEDURE TRAY) ×4 IMPLANT
PAD OB MATERNITY 4.3X12.25 (PERSONAL CARE ITEMS) ×4 IMPLANT
POUCH LAPAROSCOPIC INSTRUMENT (MISCELLANEOUS) ×8 IMPLANT
POUCH SPECIMEN RETRIEVAL 10MM (ENDOMECHANICALS) ×4 IMPLANT
PROTECTOR NERVE ULNAR (MISCELLANEOUS) ×8 IMPLANT
SEAL ROD LENS SCOPE MYOSURE (ABLATOR) ×4 IMPLANT
SET IRRIG TUBING LAPAROSCOPIC (IRRIGATION / IRRIGATOR) ×4 IMPLANT
SHEARS HARMONIC ACE PLUS 36CM (ENDOMECHANICALS) IMPLANT
SLEEVE ADV FIXATION 5X100MM (TROCAR) IMPLANT
SLEEVE XCEL OPT CAN 5 100 (ENDOMECHANICALS) ×4 IMPLANT
SUT VICRYL 0 UR6 27IN ABS (SUTURE) ×4 IMPLANT
SUT VICRYL 4-0 PS2 18IN ABS (SUTURE) ×4 IMPLANT
SYR 30ML LL (SYRINGE) IMPLANT
SYSTEM CARTER THOMASON II (TROCAR) ×4 IMPLANT
TOWEL OR 17X24 6PK STRL BLUE (TOWEL DISPOSABLE) ×8 IMPLANT
TRAY FOLEY CATH SILVER 14FR (SET/KITS/TRAYS/PACK) ×4 IMPLANT
TRENDGUARD 450 HYBRID PRO PACK (MISCELLANEOUS) ×4
TRENDGUARD 600 HYBRID PROC PK (MISCELLANEOUS)
TROCAR ADV FIXATION 5X100MM (TROCAR) IMPLANT
TROCAR XCEL NON-BLD 11X100MML (ENDOMECHANICALS) ×4 IMPLANT
TROCAR XCEL NON-BLD 5MMX100MML (ENDOMECHANICALS) ×4 IMPLANT
TUBING AQUILEX INFLOW (TUBING) ×4 IMPLANT
TUBING AQUILEX OUTFLOW (TUBING) ×4 IMPLANT
WARMER LAPAROSCOPE (MISCELLANEOUS) ×4 IMPLANT

## 2016-09-29 NOTE — Anesthesia Preprocedure Evaluation (Signed)
Anesthesia Evaluation  Patient identified by MRN, date of birth, ID band Patient awake    Reviewed: Allergy & Precautions, H&P , NPO status , Patient's Chart, lab work & pertinent test results  Airway Mallampati: II   Neck ROM: full    Dental   Pulmonary shortness of breath, former smoker,  H/o lung surgery. S/p XRT   breath sounds clear to auscultation       Cardiovascular hypertension, + DOE   Rhythm:regular Rate:Normal     Neuro/Psych    GI/Hepatic   Endo/Other  Hypothyroidism   Renal/GU      Musculoskeletal   Abdominal   Peds  Hematology   Anesthesia Other Findings   Reproductive/Obstetrics                             Anesthesia Physical Anesthesia Plan  ASA: II  Anesthesia Plan: General   Post-op Pain Management:    Induction: Intravenous  PONV Risk Score and Plan: 4 or greater and Ondansetron, Dexamethasone, Propofol, Midazolam, Scopolamine patch - Pre-op and Treatment may vary due to age or medical condition  Airway Management Planned: Oral ETT  Additional Equipment:   Intra-op Plan:   Post-operative Plan: Extubation in OR  Informed Consent: I have reviewed the patients History and Physical, chart, labs and discussed the procedure including the risks, benefits and alternatives for the proposed anesthesia with the patient or authorized representative who has indicated his/her understanding and acceptance.     Plan Discussed with: CRNA, Anesthesiologist and Surgeon  Anesthesia Plan Comments:         Anesthesia Quick Evaluation

## 2016-09-29 NOTE — Anesthesia Procedure Notes (Signed)
Procedure Name: Intubation Date/Time: 09/29/2016 7:41 AM Performed by: Flossie Dibble Pre-anesthesia Checklist: Timeout performed, Patient being monitored, Suction available, Emergency Drugs available and Patient identified Patient Re-evaluated:Patient Re-evaluated prior to inductionOxygen Delivery Method: Circle system utilized Preoxygenation: Pre-oxygenation with 100% oxygen Intubation Type: IV induction Ventilation: Mask ventilation without difficulty Laryngoscope Size: Mac, 3 and Glidescope Grade View: Grade III Tube type: Oral Tube size: 7.0 mm Number of attempts: 1 (Look ithe MAC 3 for a few seconds, only epiglottis viewed. On to Glide/LoPro 3, intubated on first attempt.) Airway Equipment and Method: Video-laryngoscopy and Stylet Secured at: 22 cm Tube secured with: Tape Dental Injury: Teeth and Oropharynx as per pre-operative assessment  Difficulty Due To: Difficult Airway- due to anterior larynx and Difficult Airway- due to dentition

## 2016-09-29 NOTE — Discharge Instructions (Addendum)
Post-surgical Instructions, Outpatient Surgery  You may expect to feel dizzy, weak, and drowsy for as long as 24 hours after receiving the medicine that made you sleep (anesthetic). For the first 24 hours after your surgery:    Do not drive a car, ride a bicycle, participate in physical activities, or take public transportation until you are done taking narcotic pain medicines or as directed by Dr. Sabra Heck.   Do not drink alcohol or take tranquilizers.   Do not take medicine that has not been prescribed by your physicians.   Do not sign important papers or make important decisions while on narcotic pain medicines.   Have a responsible person with you.   CARE OF INCISION  You may shower on the first day after your surgery.  Do not sit in a tub bath for one week.  Avoid heavy lifting (more than 10 pounds/4.5 kilograms), pushing, or pulling.   Avoid activities that may risk injury to your incisions.   PAIN MANAGEMENT  Motrin 800mg .  (This is the same as 4-200mg  over the counter tablets of Motrin or ibuprofen.)  You may take this every eight hours or as needed for cramping.    Vicodin 5/325mg .  For more severe pain, take one or two tablets every four to six hours as needed for pain control.  (Remember that narcotic pain medications increase your risk of constipation.  If this becomes a problem, you may take an over the counter stool softener like Colace 100mg  up to four times a day.)  DO'S AND DON'T'S  Do not take a tub bath for one week.  You may shower on the first day after your surgery  Do not do any heavy lifting for one to two weeks.  This increases the chance of bleeding.  Do move around as you feel able.  Stairs are fine.  You may begin to exercise again as you feel able.  Do not lift any weights for two weeks.  Do not put anything in the vagina for two weeks--no tampons, intercourse, or douching.    REGULAR MEDIATIONS/VITAMINS:  You may restart all of your regular  medications as prescribed.  You may restart your aspirin.  You may restart all of your vitamins as you normally take them.    PLEASE CALL OR SEEK MEDICAL CARE IF:  You have persistent nausea and vomiting.   You have trouble eating or drinking.   You have an oral temperature above 100.5.   You have constipation that is not helped by adjusting diet or increasing fluid intake. Pain medicines are a common cause of constipation.   You have heavy vaginal bleeding  You have redness or drainage from your incision(s) or there is increasing pain or tenderness near or in the surgical site.    Post Anesthesia Home Care Instructions  Activity: Get plenty of rest for the remainder of the day. A responsible individual must stay with you for 24 hours following the procedure.  For the next 24 hours, DO NOT: -Drive a car -Paediatric nurse -Drink alcoholic beverages -Take any medication unless instructed by your physician -Make any legal decisions or sign important papers.  Meals: Start with liquid foods such as gelatin or soup. Progress to regular foods as tolerated. Avoid greasy, spicy, heavy foods. If nausea and/or vomiting occur, drink only clear liquids until the nausea and/or vomiting subsides. Call your physician if vomiting continues.  Special Instructions/Symptoms: Your throat may feel dry or sore from the anesthesia or the breathing  tube placed in your throat during surgery. If this causes discomfort, gargle with warm salt water. The discomfort should disappear within 24 hours.  I

## 2016-09-29 NOTE — H&P (Signed)
Tina Owen is an 71 y.o. female G2P2 MWF with hx of 7.7cm thick walled, multiseptate cystic right ovarian lesion here for definitve treatment with laparoscopic BSO. She did have a normal ca-125 with her evaluation for this as well as was seen by gyn/oncology, Dr. Denman George, who recommended this could be done without gyn/onc involvement.    Pt has experienced intermittent PMP bleeding.  Endometrial biopsy was done 4/18 and this showed atrophic endometrium.  Ultrasound showed endometrium to measure 3.71mm.  She had experienced no further bleeding.  Dr. Denman George recommended D&C today as well but without additional bleeding, I do not feel this is needed.    Procedures, risks/benefits and alternatives have all been reviewed.  Pt is here and ready to proceed.    Of note, pt does have remove hx of lung cancer that was treated and pt has done well since.  She does not have any issues with SOB.    Pertinent Gynecological History: Menses: post-menopausal Bleeding: post menopausal bleeding Contraception: post menopausal status DES exposure: denies Blood transfusions: as a baby Sexually transmitted diseases: no past history Previous GYN Procedures: none  Last mammogram: normal Date: 11/17 Last pap: normal Date: 4/18 OB History: G2, P2   Menstrual History: Patient's last menstrual period was 03/23/1994 (approximate).    Past Medical History:  Diagnosis Date  . Allergy    SEASONAL  . Bilateral hip pain   . Blood transfusion without reported diagnosis    as an infant. 27 week old, 60  . Cancer ([place])    LUNG, 1998 - surgery, chemo, radiation  . Colonic polyp 2004  . Dyslipidemia   . Dyspnea   . FHx: cardiovascular disease   . Fibroid   . Heart murmur   . Hypertension   . Hypothyroid     Past Surgical History:  Procedure Laterality Date  . BREAST CYST EXCISION Left    age [age]  . CATARACT EXTRACTION, BILATERAL    . CESAREAN SECTION  [id number]   x2  . CHOLECYSTECTOMY  1998  .  COLONOSCOPY    . LUNG CANCER SURGERY Left 1998   L upper lobectomy    . PERICARDIAL WINDOW  2010   Dr Arlyce Dice  . TRANSTHORACIC ECHOCARDIOGRAM  10/2010   EF>55%; LA mild-mod dilated; borderline RA englargement; mild MR, trace TR, mild AV regurg, trace PV regurg   . TUBAL LIGATION  1990    Family History  Problem Relation Age of Onset  . Hypertension Mother   . Heart disease Mother   . Hypertension Father   . Heart disease Father   . Heart disease Maternal Grandmother   . Heart disease Maternal Grandfather   . Colon cancer Brother        this was a half brother    Social History:  reports that she quit smoking about 21 years ago. She has never used smokeless tobacco. She reports that she drinks about 4.2 oz of alcohol per week . She reports that she does not use drugs.  Allergies:  Allergies  Allergen Reactions  . Tape Itching    Redness and scars  PAPER TAPE IS OKAY    No prescriptions prior to admission.    Review of Systems  All other systems reviewed and are negative.   Last menstrual period 03/23/1994. Physical Exam  Constitutional: She is oriented to person, place, and time. She appears well-developed and well-nourished.  Cardiovascular: Normal rate and regular rhythm.   Respiratory: Effort normal and breath sounds  normal.  Neurological: She is alert and oriented to person, place, and time.  Skin: Skin is warm and dry.  Psychiatric: She has a normal mood and affect.    No results found for this or any previous visit (from the past 24 hour(s)).  No results found.  Assessment/Plan: 71 yo G2P2 MWF with 7.7cm thick walled right ovarian lesion here for laparoscopic BSO as well as hysteroscopy with D&C due to intermittent PMP bleeding.  Procedures reviewed and questions answered.  Pt ready to proceed.  Tina Owen Tina Owen 09/29/2016, 12:57 AM

## 2016-09-29 NOTE — Op Note (Signed)
09/29/2016  9:08 AM  PATIENT:  Tina Owen  71 y.o. female  PRE-OPERATIVE DIAGNOSIS:  7 cm ovarian neoplasm, Post Menopausal Bleeding  POST-OPERATIVE DIAGNOSIS:  7 cm ovarian neoplasm, Post Menopausal Bleeding  PROCEDURE:  Procedure(s): LAPAROSCOPIC BILATERAL SALPINGO OOPHORECTOMY WITH WASHINGS  SURGEON:  Rakeen Gaillard SUZANNE  ASSISTANTS: Dr. Sumner Boast   ANESTHESIA:   general  ESTIMATED BLOOD LOSS: 20cc   BLOOD ADMINISTERED:  None  FLUIDS: 1000ccLR  UOP: 100cc clear UOP  SPECIMEN:  Bilateral fallopian tubes and ovaries, cytologic washing  DISPOSITION OF SPECIMEN:  PATHOLOGY  FINDINGS: normal and atrophic left ovary, enlarged and multi-cystic left ovary without adhesions, normal upper abdomen with normal liver edge and normal stomach  DESCRIPTION OF OPERATION: Patient is taken to the operating room. She is placed in the supine position. She is a running IV in place. Informed consent was present on the chart. SCDs on her lower extremities and functioning properly.  Her legs are placed in the low lithotomy position in [place]. Her arms were tucked by the side.  Then general endotracheal anesthesia was administered by the anesthesia staff without difficulty.   Chlora prep was then used to prep the abdomen and Betadine was used to prep the inner thighs, perineum and vagina. Once 3 minutes had past the patient was draped in a normal standard fashion. The legs were lifted to the high lithotomy position. The cervix was visualized by placing a bivalved speculum in the vagina.  The cervix was grasped with a single toothed tenaculum on the anterior lip of the cervix.  The Hulka manipulator was passed through the cervical os and then attached the to anterior lip of the cervix.  The tenaculum was then removed.  The speculum was removed.    The umbilicus was everted.  A Veress needle was obtained. Syringe of sterile saline was placed on a open Veress needle.  This was passed into  the umbilicus until just when the fluid started to drip.  Then low flow CO2 gas was attached the needle and the pneumoperitoneum was achieved without difficulty. Once four liters of gas was in the abdomen the Veress needle was removed and a 5 millimeter non-bladed Optiview trocar and port were passed directly to the abdomen. The laparoscope was then used to confirm intraperitoneal placement.  Locations for RLQ and LLQ ports were noted by transillumination of the abdominal wall.  0.25% marcaine was used to anesthetize the skin.  106mm skin incision was made in the RLQ and a 10mm non-bladed trochar and port was placed.  Then a 26mm skin incision was made in the LLQ and a 10-11 port was placed in the LLQ.     Pt was placed in Trendelenburg positioning.  Ureters were identifies.  Normal left ovary and abnormal right ovary was identified.  Cytologic washings were obtained.  Attention was turned to the right ovary.  With ovary on stretch away from the side wall, the left IP ligament was serially clamped, cauterized and incised.  Then the mesosalpinx was clamped, cauterized and incised.  Then the utero-ovarian pedicle was clamped, cauterized and incised without difficulty, freeing the ovary.  This was placed in the pelvis.  Then attention was turned to the left side. With uterus on stretch the left IP ligament was serially clamped cauterized and incised.  Then the mesosalpinx was incised and then the left uteroovarian pedicle was serially clamped, cauterized and incised.  This specimen was easily removed through the left port.  Then a small endo catch bag was placed in the abdomen and the left tube and ovary was placed in the bag.  This was brought up to the skin.  The ovary was incised with a #11 blade and fluid was drained.  The ovary needed to be cut inside the bag until it was small enough to be removed.  There was a firm, rubbery area present on the ovary.    At this point, procedure was completed.   Pneumoperitoneum was relieved and pedicle watched.  No bleeding noted.  CO2  Placed back into pelvis.  Pedicles all inspected.  Excellent hemostasis was noted.  Then using a Minette Headland fascial closure device, the left incision was closed at the fascial layer without difficulty.  At this point, pt was taken out of Trendelenburg and pneumoperitoneum was relieved.  Trochars were removed.    The skin was then closed with subcuticular stitches of 3-0 Vicryl. The skin was cleansed Dermabond was applied. Attention was then turned the vagina and the vaginal instruments were removed.  No bleeding was noted from the cervix or vagina.  Foley was removed.  Sponge, lap, needle, initially counts were correct x2. Patient tolerated the procedure very well. She was awakened from anesthesia, extubated and taken to recovery in stable condition.   COUNTS:  YES  PLAN OF CARE: Transfer to PACU

## 2016-09-29 NOTE — Transfer of Care (Signed)
Immediate Anesthesia Transfer of Care Note  Patient: [name]  Procedure(s) Performed: Procedure(s): LAPAROSCOPIC BILATERAL SALPINGO OOPHORECTOMY WITH WASHINGS (N/A)  Patient Location: PACU  Anesthesia Type:General  Level of Consciousness: awake, alert  and oriented  Airway & Oxygen Therapy: Patient Spontanous Breathing and Patient connected to nasal cannula oxygen  Post-op Assessment: Report given to RN and Post -op Vital signs reviewed and stable  Post vital signs: Reviewed and stable  Last Vitals:  Vitals:   09/29/16 0617  BP: 130/64  Pulse: 66  Resp: 16  Temp: 36.8 C    Last Pain:  Vitals:   09/29/16 0617  TempSrc: Oral      Patients Stated Pain Goal: 3 (82/95/62 1308)  Complications: No apparent anesthesia complications

## 2016-09-30 ENCOUNTER — Encounter (HOSPITAL_COMMUNITY): Payer: Self-pay | Admitting: Obstetrics & Gynecology

## 2016-10-02 NOTE — Anesthesia Postprocedure Evaluation (Signed)
Anesthesia Post Note  Patient: Tina Owen  Procedure(s) Performed: Procedure(s) (LRB): LAPAROSCOPIC BILATERAL SALPINGO OOPHORECTOMY WITH WASHINGS (N/A)     Patient location during evaluation: PACU Anesthesia Type: General Level of consciousness: awake and alert and patient cooperative Pain management: pain level controlled Vital Signs Assessment: post-procedure vital signs reviewed and stable Respiratory status: spontaneous breathing and respiratory function stable Cardiovascular status: stable Anesthetic complications: no    Last Vitals:  Vitals:   09/29/16 1030 09/29/16 1100  BP:  130/60  Pulse:  60  Resp: 16 16  Temp:  36.6 C    Last Pain:  Vitals:   10/01/16 1401  TempSrc:   PainSc: 0-No pain                 Iden Stripling S

## 2016-10-12 NOTE — Progress Notes (Signed)
Post Operative Visit  Procedure: LAPAROSCOPIC BILATERAL SALPINGO OOPHORECTOMY WITH WASHINGS Days Post-op: 14 days  Subjective: Doing well.  Had laparoscopic BSO 09/29/16.  Pathology showed benign serous cystadenoma.  Pt reports she has done very well.  Had minimal pain.  Only took pain medication for two days.  Had minimal spotting for two days.  Denies urinary issues.  Had a little constipation but this has improved.  Pathology report and pictures reviewed.  Denies fever.  Objective: BP 120/88 (BP Location: Right Arm, Patient Position: Sitting, Cuff Size: Normal)   Pulse 72   Resp 14   Ht 5' 7.5" (1.715 m)   Wt 177 lb 4 oz (80.4 kg)   LMP 03/23/1994 (Approximate)   BMI 27.35 kg/m   EXAM General: alert and cooperative Resp: clear to auscultation bilaterally and normal percussion bilaterally Cardio: regular rate and rhythm, S1, S2 normal, no murmur, click, rub or gallop GI: soft, non-tender; bowel sounds normal; no masses,  no organomegaly and incision: clean, dry and intact Extremities: extremities normal, atraumatic, no cyanosis or edema Vaginal Bleeding: none  Assessment: s/p laparoscopic BSO  Plan: Pt knows she can call with any future gynecological issues/problems and I would be happy to see her.

## 2016-10-13 ENCOUNTER — Ambulatory Visit (INDEPENDENT_AMBULATORY_CARE_PROVIDER_SITE_OTHER): Payer: Medicare Other | Admitting: Obstetrics & Gynecology

## 2016-10-13 ENCOUNTER — Encounter: Payer: Self-pay | Admitting: Obstetrics & Gynecology

## 2016-10-13 VITALS — BP 120/88 | HR 72 | Resp 14 | Ht 67.5 in | Wt 177.2 lb

## 2016-10-13 DIAGNOSIS — Z9889 Other specified postprocedural states: Secondary | ICD-10-CM

## 2016-11-25 ENCOUNTER — Other Ambulatory Visit: Payer: Self-pay | Admitting: Internal Medicine

## 2017-01-22 LAB — HM MAMMOGRAPHY

## 2017-01-25 ENCOUNTER — Encounter: Payer: Self-pay | Admitting: Family Medicine

## 2017-02-16 ENCOUNTER — Other Ambulatory Visit: Payer: Self-pay | Admitting: Internal Medicine

## 2017-02-16 NOTE — Telephone Encounter (Signed)
REFILL 

## 2017-02-23 ENCOUNTER — Other Ambulatory Visit: Payer: Self-pay | Admitting: Family Medicine

## 2017-02-23 DIAGNOSIS — E785 Hyperlipidemia, unspecified: Secondary | ICD-10-CM

## 2017-02-23 DIAGNOSIS — E039 Hypothyroidism, unspecified: Secondary | ICD-10-CM

## 2017-02-23 NOTE — Telephone Encounter (Signed)
LMTCB

## 2017-02-25 NOTE — Telephone Encounter (Signed)
Scheduled appt for 12/20

## 2017-03-11 ENCOUNTER — Encounter: Payer: Self-pay | Admitting: Family Medicine

## 2017-03-11 ENCOUNTER — Ambulatory Visit: Payer: Medicare Other | Admitting: Family Medicine

## 2017-03-11 VITALS — BP 130/80 | HR 74 | Ht 67.5 in | Wt 180.4 lb

## 2017-03-11 DIAGNOSIS — L821 Other seborrheic keratosis: Secondary | ICD-10-CM | POA: Diagnosis not present

## 2017-03-11 DIAGNOSIS — E785 Hyperlipidemia, unspecified: Secondary | ICD-10-CM

## 2017-03-11 DIAGNOSIS — E039 Hypothyroidism, unspecified: Secondary | ICD-10-CM

## 2017-03-11 DIAGNOSIS — Z8249 Family history of ischemic heart disease and other diseases of the circulatory system: Secondary | ICD-10-CM | POA: Diagnosis not present

## 2017-03-11 DIAGNOSIS — Z85118 Personal history of other malignant neoplasm of bronchus and lung: Secondary | ICD-10-CM | POA: Diagnosis not present

## 2017-03-11 DIAGNOSIS — Z0000 Encounter for general adult medical examination without abnormal findings: Secondary | ICD-10-CM

## 2017-03-11 DIAGNOSIS — Z9849 Cataract extraction status, unspecified eye: Secondary | ICD-10-CM | POA: Insufficient documentation

## 2017-03-11 DIAGNOSIS — J301 Allergic rhinitis due to pollen: Secondary | ICD-10-CM | POA: Diagnosis not present

## 2017-03-11 DIAGNOSIS — I340 Nonrheumatic mitral (valve) insufficiency: Secondary | ICD-10-CM | POA: Diagnosis not present

## 2017-03-11 DIAGNOSIS — Z9079 Acquired absence of other genital organ(s): Secondary | ICD-10-CM | POA: Diagnosis not present

## 2017-03-11 DIAGNOSIS — Z23 Encounter for immunization: Secondary | ICD-10-CM

## 2017-03-11 DIAGNOSIS — I10 Essential (primary) hypertension: Secondary | ICD-10-CM | POA: Diagnosis not present

## 2017-03-11 DIAGNOSIS — I351 Nonrheumatic aortic (valve) insufficiency: Secondary | ICD-10-CM | POA: Diagnosis not present

## 2017-03-11 DIAGNOSIS — Z90722 Acquired absence of ovaries, bilateral: Secondary | ICD-10-CM

## 2017-03-11 LAB — COMPREHENSIVE METABOLIC PANEL
AG Ratio: 1.4 (calc) (ref 1.0–2.5)
ALBUMIN MSPROF: 4.2 g/dL (ref 3.6–5.1)
ALKALINE PHOSPHATASE (APISO): 62 U/L (ref 33–130)
ALT: 15 U/L (ref 6–29)
AST: 18 U/L (ref 10–35)
BILIRUBIN TOTAL: 0.9 mg/dL (ref 0.2–1.2)
BUN: 24 mg/dL (ref 7–25)
CALCIUM: 10.1 mg/dL (ref 8.6–10.4)
CHLORIDE: 103 mmol/L (ref 98–110)
CO2: 25 mmol/L (ref 20–32)
CREATININE: 0.88 mg/dL (ref 0.60–0.93)
GLOBULIN: 2.9 g/dL (ref 1.9–3.7)
Glucose, Bld: 104 mg/dL — ABNORMAL HIGH (ref 65–99)
POTASSIUM: 3.8 mmol/L (ref 3.5–5.3)
Sodium: 139 mmol/L (ref 135–146)
Total Protein: 7.1 g/dL (ref 6.1–8.1)

## 2017-03-11 LAB — CBC WITH DIFFERENTIAL/PLATELET
BASOS PCT: 0.6 %
Basophils Absolute: 41 cells/uL (ref 0–200)
EOS ABS: 109 {cells}/uL (ref 15–500)
Eosinophils Relative: 1.6 %
HEMATOCRIT: 40.9 % (ref 35.0–45.0)
Hemoglobin: 14.4 g/dL (ref 11.7–15.5)
LYMPHS ABS: 1210 {cells}/uL (ref 850–3900)
MCH: 30.9 pg (ref 27.0–33.0)
MCHC: 35.2 g/dL (ref 32.0–36.0)
MCV: 87.8 fL (ref 80.0–100.0)
MPV: 10.2 fL (ref 7.5–12.5)
Monocytes Relative: 12.7 %
NEUTROS PCT: 67.3 %
Neutro Abs: 4576 cells/uL (ref 1500–7800)
PLATELETS: 176 10*3/uL (ref 140–400)
RBC: 4.66 10*6/uL (ref 3.80–5.10)
RDW: 12.3 % (ref 11.0–15.0)
TOTAL LYMPHOCYTE: 17.8 %
WBC: 6.8 10*3/uL (ref 3.8–10.8)
WBCMIX: 864 {cells}/uL (ref 200–950)

## 2017-03-11 LAB — LIPID PANEL
CHOLESTEROL: 217 mg/dL — AB (ref <200)
HDL: 59 mg/dL (ref >50)
LDL Cholesterol (Calc): 128 mg/dL (calc) — ABNORMAL HIGH
Non-HDL Cholesterol (Calc): 158 mg/dL (calc) — ABNORMAL HIGH (ref <130)
Total CHOL/HDL Ratio: 3.7 (calc) (ref <5.0)
Triglycerides: 180 mg/dL — ABNORMAL HIGH (ref <150)

## 2017-03-11 LAB — POCT URINALYSIS DIP (PROADVANTAGE DEVICE)
BILIRUBIN UA: NEGATIVE
GLUCOSE UA: NEGATIVE mg/dL
Ketones, POC UA: NEGATIVE mg/dL
Nitrite, UA: NEGATIVE
Protein Ur, POC: NEGATIVE mg/dL
RBC UA: NEGATIVE
SPECIFIC GRAVITY, URINE: 1.025
Urobilinogen, Ur: NEGATIVE
pH, UA: 6 (ref 5.0–8.0)

## 2017-03-11 LAB — TSH: TSH: 2.85 mIU/L (ref 0.40–4.50)

## 2017-03-11 MED ORDER — LEVOTHYROXINE SODIUM 100 MCG PO TABS: 100.0000 ug | ORAL_TABLET | Freq: Every day | ORAL | 3 refills | Status: DC

## 2017-03-11 MED ORDER — NEBIVOLOL HCL 10 MG PO TABS: 10.0000 mg | ORAL_TABLET | Freq: Every day | ORAL | 3 refills | Status: DC

## 2017-03-11 MED ORDER — CHLORTHALIDONE 25 MG PO TABS: ORAL_TABLET | ORAL | 3 refills | Status: DC

## 2017-03-11 MED ORDER — ATORVASTATIN CALCIUM 80 MG PO TABS: 80.0000 mg | ORAL_TABLET | Freq: Every day | ORAL | 3 refills | Status: DC

## 2017-03-11 NOTE — Progress Notes (Signed)
Tina Owen is a 71 y.o. female who presents for annual wellness visit complete exam and follow-up on chronic medical conditions.  She has the following concerns:  moles mainly on her back that she would like evaluated.  She has had a recent salpingo-oophorectomy and seems to be doing well from that.  She is also had bilateral cataract surgery and her vision is doing much better.  She has remote history of lung cancer and also has a family history of heart disease.  She is on a statin drug and having no difficulty with that.  She also is taking Hydrea time.  She does have an underlying history of valvular disease and is followed yearly by cardiology.  No chest pain, shortness of breath, PND or DOE.  Continues on her thyroid medicine without difficulty.  Her allergies seem to be under good control.  Social and family history was also reviewed  Immunizations and Health Maintenance Immunization History  Administered Date(s) Administered  . DTaP 01/01/1986  . Influenza Split 02/05/2011, 01/19/2013  . Influenza-Unspecified 12/18/2013, 12/21/2014, 12/24/2015, 01/25/2017  . Pneumococcal Polysaccharide-23 12/11/2005, 02/08/2012  . Tdap 02/03/2008, 02/05/2011  . Zoster 05/11/2006  . Zoster Recombinat (Shingrix) 07/13/2016   Health Maintenance Due  Topic Date Due  . PNA vac Low Risk Adult (2 of 2 - PCV13) 02/07/2013    Last Pap smear:   complete Last mammogram:  01/22/17 Last colonoscopy: 12/13/2012 Last DEXA: complete Dentist: Eliezer Bottom- reg dentist, [place]- gum doctor.  OphthoGershon Crane Exercise: no but has active lifestyles.   Other doctors caring for patient include:Miller Gershon Crane  Advanced directives: yes- on file    Depression screen:  See questionnaire below.  Depression screen [facility] 2/9 03/11/2017 02/26/2016 02/20/2015 02/07/2014 02/08/2012  Decreased Interest 0 0 0 0 -  Down, Depressed, Hopeless 0 0 0 0 0  PHQ - 2 Score 0 0 0 0 0    Fall Risk Screen: see questionnaire below. Fall  Risk  03/11/2017 02/26/2016 02/20/2015 02/07/2014 02/06/2013  Falls in the past year? No No No No No    ADL screen:  See questionnaire below Functional Status Survey: Is the patient deaf or have difficulty hearing?: No Does the patient have difficulty seeing, even when wearing glasses/contacts?: No Does the patient have difficulty concentrating, remembering, or making decisions?: No Does the patient have difficulty walking or climbing stairs?: No Does the patient have difficulty dressing or bathing?: No Does the patient have difficulty doing errands alone such as visiting a doctor's office or shopping?: No   Review of Systems Negative except as above   PHYSICAL EXAM:  LMP 03/23/1994 (Approximate)   General Appearance: Alert, cooperative, no distress, appears stated age Head: Normocephalic, without obvious abnormality, atraumatic Eyes: PERRL, conjunctiva/corneas clear, EOM's intact, fundi benign Ears: Normal TM's and external ear canals Nose: Nares normal, mucosa normal, no drainage or sinus tenderness Throat: Lips, mucosa, and tongue normal; teeth and gums normal Neck: Supple, no lymphadenopathy;  thyroid:  no enlargement/tenderness/nodules; no carotid bruit or JVD Lungs: Clear to auscultation bilaterally without wheezes, rales or ronchi; respirations unlabored Heart: Regular rate and rhythm, S1 and S2 normal, systolic murmur noted  abdomen: Soft, non-tender, nondistended, normoactive bowel sounds,  no masses, no hepatosplenomegaly Extremities: No clubbing, cyanosis or edema Pulses: 2+ and symmetric all extremities Skin: Multiple round well-demarcated pigmented lesions are noted on her back.  There is a V-shaped lesion with slightly irregular coloration on the mid back area. Lymph nodes: Cervical, supraclavicular, and axillary nodes normal Neurologic:  CNII-XII intact, normal strength, sensation and gait; reflexes 2+ and symmetric throughout Psych: Normal mood, affect, hygiene  and grooming.  ASSESSMENT/PLAN: Routine general medical examination at a health care facility - Plan: POCT Urinalysis DIP (Proadvantage Device), CBC with Differential/Platelet, Comprehensive metabolic panel, Lipid panel  History of bilateral salpingo-oophorectomy  History of cataract extraction, unspecified laterality  Family history of heart disease in female family member before age 44  History of lung cancer  Hyperlipidemia with target LDL less than 100 - Plan: atorvastatin (LIPITOR) 80 MG tablet  Essential hypertension - Plan: chlorthalidone (HYGROTON) 25 MG tablet, nebivolol (BYSTOLIC) 10 MG tablet  Allergic rhinitis due to pollen, unspecified seasonality  Hypothyroidism, unspecified type - Plan: TSH, levothyroxine (SYNTHROID, LEVOTHROID) 100 MCG tablet  Mitral valve insufficiency, unspecified etiology  Nonrheumatic aortic valve insufficiency  Need for vaccination against Streptococcus pneumoniae - Plan: Pneumococcal conjugate vaccine 13-valent  Seborrheic keratosis  She will continue on her present medication regimen.  Did recommend she come back for biopsy of the lesion on her mid back.  Otherwise encouraged her to continue to take good care of herself and enjoy her retirement.     Medicare Attestation I have personally reviewed: The patient's medical and social history Their use of alcohol, tobacco or illicit drugs Their current medications and supplements The patient's functional ability including ADLs,fall risks, home safety risks, cognitive, and hearing and visual impairment Diet and physical activities Evidence for depression or mood disorders  The patient's weight, height, and BMI have been recorded in the chart.  I have made referrals, counseling, and provided education to the patient based on review of the above and I have provided the patient with a written personalized care plan for preventive services.     Jill Alexanders, MD   03/11/2017

## 2017-03-24 DIAGNOSIS — R011 Cardiac murmur, unspecified: Secondary | ICD-10-CM | POA: Insufficient documentation

## 2017-03-29 ENCOUNTER — Ambulatory Visit: Payer: Medicare Other | Admitting: Family Medicine

## 2017-03-29 ENCOUNTER — Encounter: Payer: Self-pay | Admitting: Family Medicine

## 2017-03-29 ENCOUNTER — Other Ambulatory Visit: Payer: Self-pay | Admitting: Family Medicine

## 2017-03-29 VITALS — BP 128/80 | HR 71 | Resp 16 | Wt 181.2 lb

## 2017-03-29 DIAGNOSIS — L989 Disorder of the skin and subcutaneous tissue, unspecified: Secondary | ICD-10-CM | POA: Diagnosis not present

## 2017-03-29 NOTE — Progress Notes (Signed)
   Subjective:    Patient ID: Tina Owen, female    DOB: [birth year], 72 y.o.   MRN: [id number]  HPI She is here for biopsy of shaped lesion with also different pigmentation in the mid back area.   Review of Systems     Objective:   Physical Exam An inverted V-shaped lesion is noted on the mid upper back area approximately 1-1/2 cm in size.       Assessment & Plan:  Skin lesion of back The lesion was injected with Xylocaine and epinephrine.  A 2 mm punch biopsy was done without difficulty.

## 2017-04-20 ENCOUNTER — Encounter: Payer: Self-pay | Admitting: Internal Medicine

## 2017-04-20 ENCOUNTER — Ambulatory Visit: Payer: Medicare Other | Admitting: Internal Medicine

## 2017-04-20 VITALS — BP 140/80 | HR 70 | Ht 67.5 in | Wt 186.8 lb

## 2017-04-20 DIAGNOSIS — I340 Nonrheumatic mitral (valve) insufficiency: Secondary | ICD-10-CM | POA: Diagnosis not present

## 2017-04-20 DIAGNOSIS — E782 Mixed hyperlipidemia: Secondary | ICD-10-CM | POA: Diagnosis not present

## 2017-04-20 DIAGNOSIS — I10 Essential (primary) hypertension: Secondary | ICD-10-CM | POA: Diagnosis not present

## 2017-04-20 MED ORDER — CHLORTHALIDONE 25 MG PO TABS: 12.5000 mg | ORAL_TABLET | Freq: Every day | ORAL | 3 refills | Status: DC

## 2017-04-20 NOTE — Patient Instructions (Signed)
Your physician wants you to follow-up in: ONE YEAR with Dr. Hilty. You will receive a reminder letter in the mail two months in advance. If you don't receive a letter, please call our office to schedule the follow-up appointment.  

## 2017-04-21 ENCOUNTER — Encounter: Payer: Self-pay | Admitting: Internal Medicine

## 2017-04-21 NOTE — Progress Notes (Signed)
OFFICE NOTE  Chief Complaint:  No complaints  Primary Care Physician: Denita Lung, MD  HPI:  Tina Owen is a pleasant 72 year old female previously followed by Dr. Rex Kras with a history of lung cancer status post radiation and chemotherapy. She also has a history of pericardial effusion which she developed almost 12 years after this episode. She underwent pericardial window and 2010 and has had no reoccurrence. She is on Lipitor for dyslipidemia without any issues and takes low-dose Bystolic. She has been exercising recently without any symptoms such as shortness of breath, worsening chest pain, presyncope or syncopal symptoms.  Tina Owen returns today for follow-up. She reports doing fairly well. She denies any chest pain or worsening shortness of breath. She does get a little short of breath particularly when walking up stairs and recently was in [place] and climbed up 4 flights of stairs and became short of breath. That however resolved quickly.  She had a recent echocardiogram which showed a preserved EF of 55-60%, mild mitral regurgitation which is stable, and no recurrent pericardial effusion. She is status post pericardial window. Blood pressure has been reportedly well controlled at home.  I saw [name] back today in the office. She reports some mild shortness of breath is not necessarily any worse than it had been previously. Blood pressure is very well controlled. Her weight is stable. She denies any chest pain.  02/19/2016  Tina Owen was seen back in the office today. She had a recent echo which shows no change in her mild degree of AI and MR, with a preserved LVEF. She reports possibly worsening DOE- LV filling pressures are elevated on her echo. She denies chest pain. BP is elevated today as well at 166/86.  04/13/2016  Tina Owen was seen in the office today in follow-up. Overall she is doing very well. Previously I had started her on chlorthalidone in  addition to her medicines. Blood pressure today is very well controlled 118/82. She denies any worsening shortness of breath. She says after starting the medicine she did have some diuresis and lost some weight. BMI today is only 27. Overall she feels well. She recently went on a cruise and had no difficulty walking with or worsening shortness of breath going up and down stairs or climbing around the [employer] and [place].  04/21/2017  Tina Owen returns today for follow-up.  Overall she seems to be doing well.  Recently she has had some weight gain from 179 186 pounds.  She says she has been off of her chlorthalidone for the past 2 weeks because she ran out of medication.  This might explain a slight increase in her blood pressure today 140/80.  He denies any chest pain or shortness of breath.  EKG is normal sinus at 70.  PMHx:  Past Medical History:  Diagnosis Date  . Allergy    SEASONAL  . Bilateral hip pain   . Blood transfusion without reported diagnosis    as an infant. 72 week old, 42  . Cancer ([place])    LUNG, 1998 - surgery, chemo, radiation  . Colonic polyp 2004  . Dyslipidemia   . Dyspnea   . FHx: cardiovascular disease   . Fibroid   . Heart murmur   . Hypertension   . Hypothyroid     Past Surgical History:  Procedure Laterality Date  . BREAST CYST EXCISION Left    age 58-14  . CATARACT EXTRACTION, BILATERAL  08/2016  .  CESAREAN SECTION  7867,[id number]   x2  . CHOLECYSTECTOMY  1998  . COLONOSCOPY    . LAPAROSCOPIC BILATERAL SALPINGO OOPHERECTOMY N/A 09/29/2016   Procedure: LAPAROSCOPIC BILATERAL SALPINGO OOPHORECTOMY WITH WASHINGS;  Surgeon: Megan Salon, MD;  Location: [facility];  Service: Gynecology;  Laterality: N/A;  . LUNG CANCER SURGERY Left 1998   L upper lobectomy    . PERICARDIAL WINDOW  2010   Dr Arlyce Dice  . TRANSTHORACIC ECHOCARDIOGRAM  10/2010   EF>55%; LA mild-mod dilated; borderline RA englargement; mild MR, trace TR, mild AV regurg, trace PV  regurg   . TUBAL LIGATION  1990    FAMHx:  Family History  Problem Relation Age of Onset  . Hypertension Mother   . Heart disease Mother   . Hypertension Father   . Heart disease Father   . Heart disease Maternal Grandmother   . Heart disease Maternal Grandfather   . Colon cancer Brother        this was a half brother    SOCHx:   reports that she quit smoking about 22 years ago. she has never used smokeless tobacco. She reports that she drinks about 4.2 oz of alcohol per week. She reports that she does not use drugs.  ALLERGIES:  Allergies  Allergen Reactions  . Tape Itching    Redness and scars  PAPER TAPE IS OKAY    ROS: Pertinent items noted in HPI and remainder of comprehensive ROS otherwise negative.  HOME MEDS: Current Outpatient Medications  Medication Sig Dispense Refill  . aspirin EC 81 MG tablet Take 81 mg by mouth daily.    Marland Kitchen atorvastatin (LIPITOR) 80 MG tablet Take 1 tablet (80 mg total) by mouth daily. 90 tablet 3  . Calcium Carbonate-Vitamin D (CALCIUM PLUS VITAMIN D PO) Take 1 tablet by mouth at bedtime.    . chlorthalidone (HYGROTON) 25 MG tablet Take 0.5 tablets (12.5 mg total) by mouth daily. 45 tablet 3  . Cholecalciferol (VITAMIN D3) 2000 units TABS Take 2,000 scoop by mouth daily.    Marland Kitchen levothyroxine (SYNTHROID, LEVOTHROID) 100 MCG tablet Take 1 tablet (100 mcg total) by mouth daily. 90 tablet 3  . Multiple Vitamin (MULTIVITAMIN WITH MINERALS) TABS tablet Take 1 tablet by mouth at bedtime.    . nebivolol (BYSTOLIC) 10 MG tablet Take 1 tablet (10 mg total) by mouth daily. 90 tablet 3   No current facility-administered medications for this visit.     LABS/IMAGING: No results found for this or any previous visit (from the past 48 hour(s)). No results found.  VITALS: BP 140/80   Pulse 70   Ht 5' 7.5" (1.715 m)   Wt 186 lb 12.8 oz (84.7 kg)   LMP 03/23/1994 (Approximate)   BMI 28.83 kg/m   EXAM: General appearance: alert and no  distress Neck: no adenopathy, no carotid bruit, no JVD, supple, symmetrical, trachea midline and thyroid not enlarged, symmetric, no tenderness/mass/nodules Lungs: clear to auscultation bilaterally Heart: regular rate and rhythm, S1, S2 normal and systolic murmur: early systolic 3/6, blowing at apex Abdomen: soft, non-tender; bowel sounds normal; no masses,  no organomegaly Extremities: extremities normal, atraumatic, no cyanosis or edema Pulses: 2+ and symmetric Skin: Skin color, texture, turgor normal. No rashes or lesions Neurologic: Grossly normal  EKG: Normal sinus rhythm at 70-personally reviewed  ASSESSMENT: 1. Remote pericardial effusion status post pericardial window - no reoccurence by echo in 10/2013 (EF 55-60%) 2. Mild mitral regurgitation, stable 3. Hyperlipidemia 4. Hypertension-controlled 5. History of lung  cancer  PLAN: 1.   Mrs. Rosado has any new complaints however recently has had a little bit of weight gain and her blood pressure is not at goal.  I suspect this could be some edema as she has had this before and she has been off of her chlorthalidone for about 2 weeks.  We will renew that today and she should resume taking it.  Otherwise no new symptoms.  Cholesterol has been a goal.  She has had no recurrence of her pericardial effusion.  Her mitral murmur is stable.  Follow-up with me annually or sooner as necessary.  Pixie Casino, MD, [facility], [place] Director of the [facility] Lipid [facility] &  Cardiovascular Risk Reduction Clinic Diplomate of the American Board of Clinical Lipidology Attending Cardiologist  Direct Dial: [phone]  Fax: [phone]  Website:  [email or web address] 04/21/2017, 10:01 AM

## 2017-05-28 ENCOUNTER — Other Ambulatory Visit: Payer: Self-pay | Admitting: Family Medicine

## 2017-05-28 DIAGNOSIS — E039 Hypothyroidism, unspecified: Secondary | ICD-10-CM

## 2017-05-28 DIAGNOSIS — E785 Hyperlipidemia, unspecified: Secondary | ICD-10-CM

## 2017-11-16 ENCOUNTER — Other Ambulatory Visit: Payer: Self-pay | Admitting: Family Medicine

## 2017-11-16 ENCOUNTER — Other Ambulatory Visit: Payer: Self-pay | Admitting: Internal Medicine

## 2017-11-16 ENCOUNTER — Telehealth: Payer: Self-pay

## 2017-11-16 DIAGNOSIS — E039 Hypothyroidism, unspecified: Secondary | ICD-10-CM

## 2017-11-16 DIAGNOSIS — I10 Essential (primary) hypertension: Secondary | ICD-10-CM

## 2017-11-16 DIAGNOSIS — E785 Hyperlipidemia, unspecified: Secondary | ICD-10-CM

## 2017-11-16 NOTE — Telephone Encounter (Signed)
Called pt to inform her that her med have been sent into [facility] her until her appt [facility]

## 2017-11-24 ENCOUNTER — Other Ambulatory Visit: Payer: Self-pay | Admitting: Family Medicine

## 2017-11-24 DIAGNOSIS — E039 Hypothyroidism, unspecified: Secondary | ICD-10-CM

## 2017-11-24 DIAGNOSIS — E785 Hyperlipidemia, unspecified: Secondary | ICD-10-CM

## 2018-01-10 ENCOUNTER — Encounter: Payer: Self-pay | Admitting: Internal Medicine

## 2018-01-31 LAB — HM MAMMOGRAPHY

## 2018-03-18 ENCOUNTER — Ambulatory Visit: Payer: Medicare Other | Admitting: Family Medicine

## 2018-03-18 ENCOUNTER — Encounter: Payer: Self-pay | Admitting: Family Medicine

## 2018-03-18 VITALS — BP 142/82 | HR 69 | Temp 98.2°F | Ht 67.5 in | Wt 177.0 lb

## 2018-03-18 DIAGNOSIS — Z0000 Encounter for general adult medical examination without abnormal findings: Secondary | ICD-10-CM

## 2018-03-18 DIAGNOSIS — E782 Mixed hyperlipidemia: Secondary | ICD-10-CM

## 2018-03-18 DIAGNOSIS — I340 Nonrheumatic mitral (valve) insufficiency: Secondary | ICD-10-CM

## 2018-03-18 DIAGNOSIS — Z85118 Personal history of other malignant neoplasm of bronchus and lung: Secondary | ICD-10-CM | POA: Diagnosis not present

## 2018-03-18 DIAGNOSIS — E785 Hyperlipidemia, unspecified: Secondary | ICD-10-CM

## 2018-03-18 DIAGNOSIS — M8580 Other specified disorders of bone density and structure, unspecified site: Secondary | ICD-10-CM

## 2018-03-18 DIAGNOSIS — Z8249 Family history of ischemic heart disease and other diseases of the circulatory system: Secondary | ICD-10-CM

## 2018-03-18 DIAGNOSIS — I351 Nonrheumatic aortic (valve) insufficiency: Secondary | ICD-10-CM

## 2018-03-18 DIAGNOSIS — E039 Hypothyroidism, unspecified: Secondary | ICD-10-CM

## 2018-03-18 DIAGNOSIS — J301 Allergic rhinitis due to pollen: Secondary | ICD-10-CM | POA: Diagnosis not present

## 2018-03-18 DIAGNOSIS — R011 Cardiac murmur, unspecified: Secondary | ICD-10-CM

## 2018-03-18 DIAGNOSIS — I10 Essential (primary) hypertension: Secondary | ICD-10-CM

## 2018-03-18 MED ORDER — CHLORTHALIDONE 25 MG PO TABS: 12.5000 mg | ORAL_TABLET | Freq: Every day | ORAL | 1 refills | Status: DC

## 2018-03-18 MED ORDER — NEBIVOLOL HCL 10 MG PO TABS
10.0000 mg | ORAL_TABLET | Freq: Every day | ORAL | 3 refills | Status: DC
Start: 2018-03-18 — End: 2019-03-09

## 2018-03-18 MED ORDER — LEVOTHYROXINE SODIUM 100 MCG PO TABS: 100.0000 ug | ORAL_TABLET | Freq: Every day | ORAL | 3 refills | Status: DC

## 2018-03-18 MED ORDER — ATORVASTATIN CALCIUM 80 MG PO TABS: 80.0000 mg | ORAL_TABLET | Freq: Every day | ORAL | 3 refills | Status: DC

## 2018-03-18 NOTE — Progress Notes (Signed)
Tina Owen is a 72 y.o. female who presents for annual wellness visit ,CPE and follow-up on chronic medical conditions.  She has a remote history of lung cancer and did have radiation which does cause difficulty with the pericarditis.  She was also found to have aortic and mitral insufficiency.  She does get follow-up concerning that.  She has had no chest pain, shortness of breath, syncopal episodes.  She continues on atorvastatin without difficulty.  She is also taking Synthroid and has had under good control.  She is on Bystolic, chlorthalidone and having no difficulty with these medications.  She does have a family history of colon cancer and will follow-up with gastroenterology concerning that.  Her allergies seem to be under good control.  She does have a history of osteopenia.  She also has a history of mixed hyperlipidemia.  In general life is going well for her.  Her marriage is going well.  She is now retired and enjoying this.  Immunizations and Health Maintenance Immunization History  Administered Date(s) Administered  . DTaP 01/01/1986  . Influenza Split 02/05/2011, 01/19/2013  . Influenza-Unspecified 12/18/2013, 12/21/2014, 12/24/2015, 01/25/2017  . Pneumococcal Conjugate-13 03/11/2017  . Pneumococcal Polysaccharide-23 12/11/2005, 02/08/2012  . Tdap 02/03/2008, 02/05/2011  . Zoster 05/11/2006  . Zoster Recombinat (Shingrix) 07/13/2016   Health Maintenance Due  Topic Date Due  . INFLUENZA VACCINE  10/21/2017  . COLONOSCOPY  12/13/2017    Last Pap smear: 2018 in summer Last mammogram:01-31-18 Last colonoscopy:12-13-12 Last DEXA:02-12-14 Dentist:q 3 months Ophtho:01-2018 Exercise:walking   Other doctors caring for patient include: Dr. Debara Pickett cardio., Dr Ninetta Lights  Advanced directives: Does Patient Have a Medical Advance Directive?: Yes Type of Advance Directive: Healthcare Power of Attorney, Living will Does patient want to make changes to medical advance directive?:  No - Patient declined Copy of [place] in Chart?: No - copy requested  Depression screen:  See questionnaire below.  Depression screen [facility] 2/9 03/18/2018 03/11/2017 02/26/2016 02/20/2015 02/07/2014  Decreased Interest 0 0 0 0 0  Down, Depressed, Hopeless 0 0 0 0 0  PHQ - 2 Score 0 0 0 0 0    Fall Risk Screen: see questionnaire below. Fall Risk  03/18/2018 03/11/2017 02/26/2016 02/20/2015 02/07/2014  Falls in the past year? 0 No No No No    ADL screen:  See questionnaire below Functional Status Survey: Is the patient deaf or have difficulty hearing?: No Does the patient have difficulty seeing, even when wearing glasses/contacts?: No Does the patient have difficulty concentrating, remembering, or making decisions?: No Does the patient have difficulty walking or climbing stairs?: No Does the patient have difficulty dressing or bathing?: No Does the patient have difficulty doing errands alone such as visiting a doctor's office or shopping?: No   Review of Systems Constitutional: -, -unexpected weight change, -anorexia, -fatigue Allergy: -sneezing, -itching, -congestion Dermatology: denies changing moles, rash, lumps ENT: -runny nose, -ear pain, -sore throat,  Cardiology:  -chest pain, -palpitations, -orthopnea, Respiratory: -cough, -shortness of breath, -dyspnea on exertion, -wheezing,  Gastroenterology: -abdominal pain, -nausea, -vomiting, -diarrhea, -constipation, -dysphagia Hematology: -bleeding or bruising problems Musculoskeletal: -arthralgias, -myalgias, -joint swelling, -back pain, - Ophthalmology: -vision changes,  Urology: -dysuria, -difficulty urinating,  -urinary frequency, -urgency, incontinence Neurology: -, -numbness, , -memory loss, -falls, -dizziness    PHYSICAL EXAM:  BP (!) 142/82 (BP Location: Left Arm, Patient Position: Sitting)   Pulse 69   Temp 98.2 F (36.8 C)   Ht 5' 7.5" (1.715 m)  Wt 177 lb (80.3 kg)   LMP 03/23/1994  (Approximate)   SpO2 99%   BMI 27.31 kg/m   General Appearance: Alert, cooperative, no distress, appears stated age Head: Normocephalic, without obvious abnormality, atraumatic Eyes: PERRL, conjunctiva/corneas clear, EOM's intact, fundi benign Ears: Normal TM's and external ear canals Nose: Nares normal, mucosa normal, no drainage or sinus tenderness Throat: Lips, mucosa, and tongue normal; teeth and gums normal Neck: Supple, no lymphadenopathy;  thyroid:  no enlargement/tenderness/nodules; no carotid bruit or JVD Lungs: Clear to auscultation bilaterally without wheezes, rales or ronchi; respirations unlabored Heart: Regular rate and rhythm, S1 and S2 normal, she has a systolic and diastolic murmur with a rub with a systolic murmur.   Abdomen: Soft, non-tender, nondistended, normoactive bowel sounds,  no masses, no hepatosplenomegaly Extremities: No clubbing, cyanosis or edema Pulses: 2+ and symmetric all extremities Skin:  Skin color, texture, turgor normal, no rashes or lesions Lymph nodes: Cervical, supraclavicular, and axillary nodes normal Neurologic:  CNII-XII intact, normal strength, sensation and gait; reflexes 2+ and symmetric throughout Psych: Normal mood, affect, hygiene and grooming.  ASSESSMENT/PLAN:Routine general medical examination at a health care facility - Plan: CBC with Differential/Platelet, Comprehensive metabolic panel, Lipid panel, TSH  History of lung cancer  Heart murmur  Family history of heart disease in female family member before age 67 - Plan: CBC with Differential/Platelet, Comprehensive metabolic panel, Lipid panel  Allergic rhinitis due to pollen, unspecified seasonality  Essential hypertension - Plan: CBC with Differential/Platelet, Comprehensive metabolic panel, nebivolol (BYSTOLIC) 10 MG tablet, chlorthalidone (HYGROTON) 25 MG tablet  Hypothyroidism, unspecified type - Plan: TSH, levothyroxine (SYNTHROID, LEVOTHROID) 100 MCG tablet  Mitral valve  insufficiency, unspecified etiology  Nonrheumatic aortic valve insufficiency  Osteopenia, unspecified location - Plan: DG Bone Density  Mixed hyperlipidemia - Plan: Lipid panel  Hyperlipidemia with target LDL less than 100 - Plan: atorvastatin (LIPITOR) 80 MG tablet She will follow-up with cardiology as well as for her colonoscopy.  She will continue on her present medication regimen.   yearly mammograms; at least 30 minutes of aerobic activity at least 5 days/week and weight-bearing exercise 2x/week; healthy diet, including goals of calcium and vitamin D intake and alcohol recommendations (less than or equal to 1 drink/day) reviewed;  Immunization recommendations discussed.  Colonoscopy recommendations reviewed   Medicare Attestation I have personally reviewed: The patient's medical and social history Their use of alcohol, tobacco or illicit drugs Their current medications and supplements The patient's functional ability including ADLs,fall risks, home safety risks, cognitive, and hearing and visual impairment Diet and physical activities Evidence for depression or mood disorders  The patient's weight, height, and BMI have been recorded in the chart.  I have made referrals, counseling, and provided education to the patient based on review of the above and I have provided the patient with a written personalized care plan for preventive services.     Jill Alexanders, MD   03/18/2018

## 2018-03-19 LAB — CBC WITH DIFFERENTIAL/PLATELET
BASOS ABS: 0 10*3/uL (ref 0.0–0.2)
Basos: 1 %
EOS (ABSOLUTE): 0.1 10*3/uL (ref 0.0–0.4)
Eos: 2 %
Hematocrit: 41.6 % (ref 34.0–46.6)
Hemoglobin: 14.4 g/dL (ref 11.1–15.9)
Immature Grans (Abs): 0 10*3/uL (ref 0.0–0.1)
Immature Granulocytes: 1 %
LYMPHS ABS: 1.4 10*3/uL (ref 0.7–3.1)
LYMPHS: 24 %
MCH: 30.2 pg (ref 26.6–33.0)
MCHC: 34.6 g/dL (ref 31.5–35.7)
MCV: 87 fL (ref 79–97)
MONOCYTES: 11 %
Monocytes Absolute: 0.6 10*3/uL (ref 0.1–0.9)
Neutrophils Absolute: 3.7 10*3/uL (ref 1.4–7.0)
Neutrophils: 61 %
Platelets: 198 10*3/uL (ref 150–450)
RBC: 4.77 x10E6/uL (ref 3.77–5.28)
RDW: 12.5 % (ref 12.3–15.4)
WBC: 5.9 10*3/uL (ref 3.4–10.8)

## 2018-03-19 LAB — LIPID PANEL
Chol/HDL Ratio: 3.6 ratio (ref 0.0–4.4)
Cholesterol, Total: 233 mg/dL — ABNORMAL HIGH (ref 100–199)
HDL: 64 mg/dL (ref >39)
LDL CALC: 141 mg/dL — AB (ref 0–99)
TRIGLYCERIDES: 139 mg/dL (ref 0–149)
VLDL CHOLESTEROL CAL: 28 mg/dL (ref 5–40)

## 2018-03-19 LAB — COMPREHENSIVE METABOLIC PANEL
ALBUMIN: 4.5 g/dL (ref 3.5–4.8)
ALK PHOS: 73 IU/L (ref 39–117)
ALT: 23 IU/L (ref 0–32)
AST: 21 IU/L (ref 0–40)
Albumin/Globulin Ratio: 1.9 (ref 1.2–2.2)
BUN / CREAT RATIO: 24 (ref 12–28)
BUN: 27 mg/dL (ref 8–27)
Bilirubin Total: 1.1 mg/dL (ref 0.0–1.2)
CO2: 25 mmol/L (ref 20–29)
CREATININE: 1.11 mg/dL — AB (ref 0.57–1.00)
Calcium: 10.4 mg/dL — ABNORMAL HIGH (ref 8.7–10.3)
Chloride: 98 mmol/L (ref 96–106)
GFR calc Af Amer: 57 mL/min/{1.73_m2} — ABNORMAL LOW (ref >59)
GFR calc non Af Amer: 49 mL/min/{1.73_m2} — ABNORMAL LOW (ref >59)
GLUCOSE: 101 mg/dL — AB (ref 65–99)
Globulin, Total: 2.4 g/dL (ref 1.5–4.5)
Potassium: 4.3 mmol/L (ref 3.5–5.2)
Sodium: 141 mmol/L (ref 134–144)
Total Protein: 6.9 g/dL (ref 6.0–8.5)

## 2018-03-19 LAB — TSH: TSH: 4.22 u[IU]/mL (ref 0.450–4.500)

## 2018-03-22 ENCOUNTER — Encounter: Payer: Self-pay | Admitting: Internal Medicine

## 2018-03-24 LAB — HM DEXA SCAN

## 2018-04-07 ENCOUNTER — Telehealth: Payer: Self-pay

## 2018-04-07 NOTE — Telephone Encounter (Signed)
Called pt to advise of dexa scan results. [place]

## 2018-04-11 ENCOUNTER — Ambulatory Visit: Payer: Medicare Other | Admitting: Internal Medicine

## 2018-04-11 ENCOUNTER — Encounter: Payer: Self-pay | Admitting: Internal Medicine

## 2018-04-11 VITALS — BP 132/80 | HR 61 | Ht 67.5 in | Wt 176.4 lb

## 2018-04-11 DIAGNOSIS — E782 Mixed hyperlipidemia: Secondary | ICD-10-CM | POA: Diagnosis not present

## 2018-04-11 DIAGNOSIS — I340 Nonrheumatic mitral (valve) insufficiency: Secondary | ICD-10-CM | POA: Diagnosis not present

## 2018-04-11 DIAGNOSIS — I10 Essential (primary) hypertension: Secondary | ICD-10-CM

## 2018-04-11 NOTE — Patient Instructions (Signed)
Medication Instructions:  Continue current medications If you need a refill on your cardiac medications before your next appointment, please call your pharmacy.   Testing/Procedures: Your physician has requested that you have an echocardiogram. Echocardiography is a painless test that uses sound waves to create images of your heart. It provides your doctor with information about the size and shape of your heart and how well your heart's chambers and valves are working. This procedure takes approximately one hour. There are no restrictions for this procedure. -- due in 1 year, prior to next visit with Dr. Debara Pickett  Follow-Up: At [facility], you and your health needs are our priority.  As part of our continuing mission to provide you with exceptional heart care, we have created designated Provider Care Teams.  These Care Teams include your primary Cardiologist (physician) and Advanced Practice Providers (APPs -  Physician Assistants and Nurse Practitioners) who all work together to provide you with the care you need, when you need it. You will need a follow up appointment in 12 months.  Please call our office 2 months in advance to schedule this appointment.  You may see Dr. Debara Pickett or one of the following Advanced Practice Providers on your designated Care Team: Almyra Deforest, [place] . Fabian Sharp, PA-C  Any Other Special Instructions Will Be Listed Below (If Applicable).

## 2018-04-11 NOTE — Progress Notes (Addendum)
OFFICE NOTE  Chief Complaint:  Routine follow-up  Primary Care Physician: Denita Lung, MD  HPI:  Tina Owen is a pleasant 73 year old female previously followed by Dr. Rex Kras with a history of lung cancer status post radiation and chemotherapy. She also has a history of pericardial effusion which she developed almost 12 years after this episode. She underwent pericardial window and 2010 and has had no reoccurrence. She is on Lipitor for dyslipidemia without any issues and takes low-dose Bystolic. She has been exercising recently without any symptoms such as shortness of breath, worsening chest pain, presyncope or syncopal symptoms.  Tina Owen returns today for follow-up. She reports doing fairly well. She denies any chest pain or worsening shortness of breath. She does get a little short of breath particularly when walking up stairs and recently was in [place] and climbed up 4 flights of stairs and became short of breath. That however resolved quickly.  She had a recent echocardiogram which showed a preserved EF of 55-60%, mild mitral regurgitation which is stable, and no recurrent pericardial effusion. She is status post pericardial window. Blood pressure has been reportedly well controlled at home.  I saw Tina Owen back today in the office. She reports some mild shortness of breath is not necessarily any worse than it had been previously. Blood pressure is very well controlled. Her weight is stable. She denies any chest pain.  02/19/2016  Tina Owen was seen back in the office today. She had a recent echo which shows no change in her mild degree of AI and MR, with a preserved LVEF. She reports possibly worsening DOE- LV filling pressures are elevated on her echo. She denies chest pain. BP is elevated today as well at 166/86.  04/13/2016  Tina Owen was seen in the office today in follow-up. Overall she is doing very well. Previously I had started her on chlorthalidone  in addition to her medicines. Blood pressure today is very well controlled 118/82. She denies any worsening shortness of breath. She says after starting the medicine she did have some diuresis and lost some weight. BMI today is only 27. Overall she feels well. She recently went on a cruise and had no difficulty walking with or worsening shortness of breath going up and down stairs or climbing around the [employer] and [place].  04/21/2017  Tina Owen returns today for follow-up.  Overall she seems to be doing well.  Recently she has had some weight gain from 179 186 pounds.  She says she has been off of her chlorthalidone for the past 2 weeks because she ran out of medication.  This might explain a slight increase in her blood pressure today 140/80.  He denies any chest pain or shortness of breath.  EKG is normal sinus at 70.  04/11/2017  Tina Owen is seen today for routine follow-up.  Overall she is doing well.  She denies any chest pain or shortness of breath.  Blood pressure was normal today 132/80.  She has upcoming routine colonoscopy in February recently had wellness check with her primary care provider.  She is maintaining on daily chlorthalidone because of edema.  Her lipid profile was at goal.  PMHx:  Past Medical History:  Diagnosis Date  . Allergy    SEASONAL  . Bilateral hip pain   . Blood transfusion without reported diagnosis    as an infant. 29 week old, 59  . Cancer ([place])    LUNG, 1998 - surgery,  chemo, radiation  . Colonic polyp 2004  . Dyslipidemia   . Dyspnea   . FHx: cardiovascular disease   . Fibroid   . Heart murmur   . Hypertension   . Hypothyroid     Past Surgical History:  Procedure Laterality Date  . BREAST CYST EXCISION Left    age [age]  . CATARACT EXTRACTION, BILATERAL  08/2016  . CESAREAN SECTION  [id number]   x2  . CHOLECYSTECTOMY  1998  . COLONOSCOPY    . LAPAROSCOPIC BILATERAL SALPINGO OOPHERECTOMY N/A 09/29/2016   Procedure:  LAPAROSCOPIC BILATERAL SALPINGO OOPHORECTOMY WITH WASHINGS;  Surgeon: Megan Salon, MD;  Location: [facility];  Service: Gynecology;  Laterality: N/A;  . LUNG CANCER SURGERY Left 1998   L upper lobectomy    . PERICARDIAL WINDOW  2010   Dr Arlyce Dice  . TRANSTHORACIC ECHOCARDIOGRAM  10/2010   EF>55%; LA mild-mod dilated; borderline RA englargement; mild MR, trace TR, mild AV regurg, trace PV regurg   . TUBAL LIGATION  1990    FAMHx:  Family History  Problem Relation Age of Onset  . Hypertension Mother   . Heart disease Mother   . Hypertension Father   . Heart disease Father   . Heart disease Maternal Grandmother   . Heart disease Maternal Grandfather   . Colon cancer Brother        this was a half brother    SOCHx:   reports that she quit smoking about 23 years ago. She has never used smokeless tobacco. She reports current alcohol use of about 7.0 standard drinks of alcohol per week. She reports that she does not use drugs.  ALLERGIES:  Allergies  Allergen Reactions  . Tape Itching    Redness and scars  PAPER TAPE IS OKAY    ROS: Pertinent items noted in HPI and remainder of comprehensive ROS otherwise negative.  HOME MEDS: Current Outpatient Medications  Medication Sig Dispense Refill  . aspirin EC 81 MG tablet Take 81 mg by mouth daily.    Marland Kitchen atorvastatin (LIPITOR) 80 MG tablet Take 1 tablet (80 mg total) by mouth daily. 90 tablet 3  . Calcium Carbonate-Vitamin D (CALCIUM PLUS VITAMIN D PO) Take 1 tablet by mouth at bedtime.    . chlorthalidone (HYGROTON) 25 MG tablet Take 0.5 tablets (12.5 mg total) by mouth daily. 45 tablet 1  . Cholecalciferol (VITAMIN D3) 2000 units TABS Take 2,000 scoop by mouth daily.    Marland Kitchen levothyroxine (SYNTHROID, LEVOTHROID) 100 MCG tablet Take 1 tablet (100 mcg total) by mouth daily. 90 tablet 3  . Multiple Vitamin (MULTIVITAMIN WITH MINERALS) TABS tablet Take 1 tablet by mouth at bedtime.    . nebivolol (BYSTOLIC) 10 MG tablet Take 1 tablet (10 mg  total) by mouth daily. 90 tablet 3   No current facility-administered medications for this visit.     LABS/IMAGING: No results found for this or any previous visit (from the past 48 hour(s)). No results found.  VITALS: Ht 5' 7.5" (1.715 m)   Wt 176 lb 6.4 oz (80 kg)   LMP 03/23/1994 (Approximate)   BMI 27.22 kg/m   EXAM: General appearance: alert and no distress Neck: no adenopathy, no carotid bruit, no JVD, supple, symmetrical, trachea midline and thyroid not enlarged, symmetric, no tenderness/mass/nodules Lungs: clear to auscultation bilaterally Heart: regular rate and rhythm, S1, S2 normal and systolic murmur: early systolic 3/6, blowing at apex Abdomen: soft, non-tender; bowel sounds normal; no masses,  no organomegaly Extremities: extremities normal,  atraumatic, no cyanosis or edema Pulses: 2+ and symmetric Skin: Skin color, texture, turgor normal. No rashes or lesions Neurologic: Grossly normal  EKG: Normal sinus rhythm at 61-personally reviewed  ASSESSMENT: 1. Remote pericardial effusion status post pericardial window - no reoccurence by echo in 10/2013 (EF 55-60%) 2. Mild mitral regurgitation, stable 3. Hyperlipidemia 4. Hypertension-controlled 5. History of lung cancer  PLAN: 1.   Tina Owen continues to do well with any recurrent shortness of breath or chest pain symptoms.  Her blood pressures well controlled and cholesterol has been at goal.  She does have some mitral regurgitation which will need to be reassessed by echo probably next year.  We will plan an echo and follow-up with me annually or sooner as necessary.  Pixie Casino, MD, [facility] [place] Director of the [facility]  Card[facility]ovascular [facility] Diplomate of the American Board of Clinical Lipidology Attending Cardiologist  Direct Dial: [phone]  Fax: [phone]  Website:  [email or web address]   Nadean Corwin Regnia Mathwig 04/11/2018, 2:25  PM

## 2018-04-12 ENCOUNTER — Encounter: Payer: Self-pay | Admitting: Internal Medicine

## 2018-04-25 ENCOUNTER — Ambulatory Visit (AMBULATORY_SURGERY_CENTER): Payer: Medicare Other

## 2018-04-25 ENCOUNTER — Encounter: Payer: Self-pay | Admitting: Internal Medicine

## 2018-04-25 VITALS — Ht 67.5 in | Wt 178.6 lb

## 2018-04-25 DIAGNOSIS — Z86010 Personal history of colonic polyps: Secondary | ICD-10-CM

## 2018-04-25 MED ORDER — NA SULFATE-K SULFATE-MG SULF 17.5-3.13-1.6 GM/177ML PO SOLN: 1.0000 | Freq: Once | ORAL | 0 refills | Status: AC

## 2018-04-25 NOTE — Progress Notes (Signed)
Denies allergies to eggs or soy products. Denies complication of anesthesia or sedation. Denies use of weight loss medication. Denies use of O2.   Emmi instructions declined.  

## 2018-05-09 ENCOUNTER — Ambulatory Visit (AMBULATORY_SURGERY_CENTER): Payer: Medicare Other | Admitting: Internal Medicine

## 2018-05-09 ENCOUNTER — Encounter: Payer: Self-pay | Admitting: Internal Medicine

## 2018-05-09 VITALS — BP 108/72 | HR 67 | Temp 97.5°F | Resp 16 | Ht 67.5 in | Wt 176.0 lb

## 2018-05-09 DIAGNOSIS — Z86010 Personal history of colonic polyps: Secondary | ICD-10-CM

## 2018-05-09 DIAGNOSIS — Z800 Family history of malignant neoplasm of digestive organs: Secondary | ICD-10-CM

## 2018-05-09 MED ORDER — SODIUM CHLORIDE 0.9 % IV SOLN: 500.0000 mL | Freq: Once | INTRAVENOUS | Status: DC

## 2018-05-09 NOTE — Progress Notes (Signed)
PT taken to PACU. Monitors in place. VSS. Report given to RN. 

## 2018-05-09 NOTE — Op Note (Signed)
[facility] Patient Name: Tina Owen Procedure Date: 05/09/2018 9:17 AM MRN: [id number] Endoscopist: Docia Chuck. Henrene Pastor , MD Age: 73 Referring MD:  Date of Birth: Nov 19, 1945 Gender: Female Account #: [id number] Procedure:                Colonoscopy Indications:              High risk colon cancer surveillance: Personal                            history of non-advanced adenomas. Also brother with                            history of colon cancer in his 4s. Previous                            examinations 1997, 2000, 2004, 2009, 2014 Medicines:                Monitored Anesthesia Care Procedure:                Pre-Anesthesia Assessment:                           - Prior to the procedure, a History and Physical                            was performed, and patient medications and                            allergies were reviewed. The patient's tolerance of                            previous anesthesia was also reviewed. The risks                            and benefits of the procedure and the sedation                            options and risks were discussed with the patient.                            All questions were answered, and informed consent                            was obtained. Prior Anticoagulants: The patient has                            taken no previous anticoagulant or antiplatelet                            agents. ASA Grade Assessment: II - A patient with                            mild systemic disease. After reviewing the risks  and benefits, the patient was deemed in                            satisfactory condition to undergo the procedure.                           After obtaining informed consent, the colonoscope                            was passed under direct vision. Throughout the                            procedure, the patient's blood pressure, pulse, and                            oxygen saturations were  monitored continuously. The                            Colonoscope was introduced through the anus and                            advanced to the the cecum, identified by                            appendiceal orifice and ileocecal valve. The                            ileocecal valve, appendiceal orifice, and rectum                            were photographed. The quality of the bowel                            preparation was excellent. The colonoscopy was                            performed without difficulty. The patient tolerated                            the procedure well. The bowel preparation used was                            SUPREP. Scope In: 9:25:25 AM Scope Out: 9:38:46 AM Scope Withdrawal Time: 0 hours 8 minutes 58 seconds  Total Procedure Duration: 0 hours 13 minutes 21 seconds  Findings:                 A few small-mouthed diverticula were found in the                            sigmoid colon.                           Internal hemorrhoids were found during retroflexion.  The exam was otherwise without abnormality on                            direct and retroflexion views. Complications:            No immediate complications. Estimated blood loss:                            None. Estimated Blood Loss:     Estimated blood loss: none. Impression:               - Diverticulosis in the sigmoid colon.                           - Internal hemorrhoids.                           - The examination was otherwise normal on direct                            and retroflexion views.                           - No specimens collected. Recommendation:           - Repeat colonoscopy in 5 years for surveillance.                           - Patient has a contact number available for                            emergencies. The signs and symptoms of potential                            delayed complications were discussed with the                             patient. Return to normal activities tomorrow.                            Written discharge instructions were provided to the                            patient.                           - Resume previous diet.                           - Continue present medications. Docia Chuck. Henrene Pastor, MD 05/09/2018 9:43:05 AM This report has been signed electronically.

## 2018-05-09 NOTE — Patient Instructions (Signed)
YOU HAD AN ENDOSCOPIC PROCEDURE TODAY AT [facility]:   Refer to the procedure report that was given to you for any specific questions about what was found during the examination.  If the procedure report does not answer your questions, please call your gastroenterologist to clarify.  If you requested that your care partner not be given the details of your procedure findings, then the procedure report has been included in a sealed envelope for you to review at your convenience later.  YOU SHOULD EXPECT: Some feelings of bloating in the abdomen. Passage of more gas than usual.  Walking can help get rid of the air that was put into your GI tract during the procedure and reduce the bloating. If you had a lower endoscopy (such as a colonoscopy or flexible sigmoidoscopy) you may notice spotting of blood in your stool or on the toilet paper. If you underwent a bowel prep for your procedure, you may not have a normal bowel movement for a few days.  Please Note:  You might notice some irritation and congestion in your nose or some drainage.  This is from the oxygen used during your procedure.  There is no need for concern and it should clear up in a day or so.  SYMPTOMS TO REPORT IMMEDIATELY:   Following lower endoscopy (colonoscopy or flexible sigmoidoscopy):  Excessive amounts of blood in the stool  Significant tenderness or worsening of abdominal pains  Swelling of the abdomen that is new, acute  Fever of 100F or higher   For urgent or emergent issues, a gastroenterologist can be reached at any hour by calling [phone].   DIET:  We do recommend a small meal at first, but then you may proceed to your regular diet.  Drink plenty of fluids but you should avoid alcoholic beverages for 24 hours.  ACTIVITY:  You should plan to take it easy for the rest of today and you should NOT DRIVE or use heavy machinery until tomorrow (because of the sedation medicines used during the test).     FOLLOW UP: Our staff will call the number listed on your records the next business day following your procedure to check on you and address any questions or concerns that you may have regarding the information given to you following your procedure. If we do not reach you, we will leave a message.  However, if you are feeling well and you are not experiencing any problems, there is no need to return our call.  We will assume that you have returned to your regular daily activities without incident.  If any biopsies were taken you will be contacted by phone or by letter within the next 1-3 weeks.  Please call us at [phone] if you have not heard about the biopsies in 3 weeks.    SIGNATURES/CONFIDENTIALITY: You and/or your care partner have signed paperwork which will be entered into your electronic medical record.  These signatures attest to the fact that that the information above on your After Visit Summary has been reviewed and is understood.  Full responsibility of the confidentiality of this discharge information lies with you and/or your care-partner.  Diverticulosis and hemorrhoid information given.

## 2018-05-10 ENCOUNTER — Telehealth: Payer: Self-pay

## 2018-05-10 NOTE — Telephone Encounter (Signed)
  Follow up Call-  Call back number 05/09/2018  Post procedure Call Back phone  # [phone]  Permission to leave phone message Yes  Some recent data might be hidden     Patient questions:  Do you have a fever, pain , or abdominal swelling? No. Pain Score  0 *  Have you tolerated food without any problems? Yes.    Have you been able to return to your normal activities? Yes.    Do you have any questions about your discharge instructions: Diet   No. Medications  No. Follow up visit  No.  Do you have questions or concerns about your Care? No.  Actions: * If pain score is 4 or above: No action needed, pain <4.

## 2018-08-16 ENCOUNTER — Encounter: Payer: Self-pay | Admitting: Obstetrics & Gynecology

## 2018-08-16 ENCOUNTER — Other Ambulatory Visit (HOSPITAL_COMMUNITY)
Admission: RE | Admit: 2018-08-16 | Discharge: 2018-08-16 | Disposition: A | Discharge status: Home / Self Care | Payer: Medicare Other | Source: Ambulatory Visit | Attending: Obstetrics & Gynecology | Admitting: Obstetrics & Gynecology

## 2018-08-16 ENCOUNTER — Ambulatory Visit: Payer: Medicare Other | Admitting: Obstetrics & Gynecology

## 2018-08-16 ENCOUNTER — Telehealth: Payer: Self-pay | Admitting: Obstetrics & Gynecology

## 2018-08-16 ENCOUNTER — Other Ambulatory Visit: Payer: Self-pay

## 2018-08-16 VITALS — BP 146/80 | HR 76 | Temp 98.1°F | Ht 67.5 in | Wt 178.0 lb

## 2018-08-16 DIAGNOSIS — Z124 Encounter for screening for malignant neoplasm of cervix: Secondary | ICD-10-CM | POA: Diagnosis not present

## 2018-08-16 DIAGNOSIS — N950 Postmenopausal bleeding: Secondary | ICD-10-CM

## 2018-08-16 NOTE — Progress Notes (Signed)
GYNECOLOGY  VISIT  CC:   Vaginal bleeding x 4 days  HPI: 73 y.o. G2P2 Married White or Caucasian female here for vaginal bleeding on and off x 4 days.  Reports she noted this twice in the past year but it went away quickly.  Denies vaginal discharge.  Denies pelvic pain.  Denies urinary symptoms.  Does have constipation from time to time and she has noted the bleeding in the past with straining.   H/o BSO 10/2016 due to serous cystadenoma.  GYNECOLOGIC HISTORY: Patient's last menstrual period was 03/23/1994 (approximate). Contraception: PMP Menopausal hormone therapy: none  Patient Active Problem List   Diagnosis Date Noted  . Heart murmur   . History of cataract extraction 03/11/2017  . History of bilateral salpingo-oophorectomy 03/11/2017  . Mitral valve insufficiency 02/19/2016  . Nonrheumatic aortic valve insufficiency 02/19/2016  . Hypertension 02/05/2011  . Hypothyroid 02/05/2011  . History of lung cancer 02/05/2011  . Mixed hyperlipidemia 02/05/2011  . Osteopenia 02/05/2011  . Family history of heart disease in female family member before age 65 02/05/2011  . Allergic rhinitis 02/05/2011    Past Medical History:  Diagnosis Date  . Allergy    SEASONAL  . Bilateral hip pain   . Blood transfusion without reported diagnosis    as an infant. 16 week old, 18  . Cancer ([place])    LUNG, 1998 - surgery, chemo, radiation  . Cataract   . Colonic polyp 2004  . Dyslipidemia   . Dyspnea   . FHx: cardiovascular disease   . Fibroid   . Heart murmur   . Hyperlipidemia   . Hypertension   . Hypothyroid   . Osteopenia     Past Surgical History:  Procedure Laterality Date  . BREAST CYST EXCISION Left    age 33-14  . CATARACT EXTRACTION, BILATERAL  08/2016  . CESAREAN SECTION  [id number]   x2  . CHOLECYSTECTOMY  1998  . COLONOSCOPY    . LAPAROSCOPIC BILATERAL SALPINGO OOPHERECTOMY N/A 09/29/2016   Procedure: LAPAROSCOPIC BILATERAL SALPINGO OOPHORECTOMY WITH WASHINGS;   Surgeon: Megan Salon, MD;  Location: [facility];  Service: Gynecology;  Laterality: N/A;  . LUNG CANCER SURGERY Left 1998   L upper lobectomy    . PERICARDIAL WINDOW  2010   Dr Arlyce Dice  . TRANSTHORACIC ECHOCARDIOGRAM  10/2010   EF>55%; LA mild-mod dilated; borderline RA englargement; mild MR, trace TR, mild AV regurg, trace PV regurg   . TUBAL LIGATION  1990    MEDS:   Current Outpatient Medications on File Prior to Visit  Medication Sig Dispense Refill  . aspirin EC 81 MG tablet Take 81 mg by mouth daily.    Marland Kitchen atorvastatin (LIPITOR) 80 MG tablet Take 1 tablet (80 mg total) by mouth daily. 90 tablet 3  . Calcium Carbonate-Vitamin D (CALCIUM PLUS VITAMIN D PO) Take 1 tablet by mouth at bedtime.    . chlorthalidone (HYGROTON) 25 MG tablet Take 0.5 tablets (12.5 mg total) by mouth daily. 45 tablet 1  . Cholecalciferol (VITAMIN D3) 2000 units TABS Take 2,000 scoop by mouth daily.    Marland Kitchen levothyroxine (SYNTHROID, LEVOTHROID) 100 MCG tablet Take 1 tablet (100 mcg total) by mouth daily. 90 tablet 3  . Multiple Vitamin (MULTIVITAMIN WITH MINERALS) TABS tablet Take 1 tablet by mouth at bedtime.    . nebivolol (BYSTOLIC) 10 MG tablet Take 1 tablet (10 mg total) by mouth daily. 90 tablet 3   No current facility-administered medications on file prior to  visit.     ALLERGIES: Tape  Family History  Problem Relation Age of Onset  . Hypertension Mother   . Heart disease Mother   . Hypertension Father   . Heart disease Father   . Heart disease Maternal Grandmother   . Heart disease Maternal Grandfather   . Colon cancer Brother        this was a half brother  . Esophageal cancer Neg Hx   . Stomach cancer Neg Hx   . Rectal cancer Neg Hx     SH:  Married, non smoker  Review of Systems  Gastrointestinal: Positive for constipation.  Genitourinary: Positive for vaginal bleeding.  All other systems reviewed and are negative.   PHYSICAL EXAMINATION:    BP (!) 146/80   Pulse 76   Temp 98.1 F  (36.7 C) (Temporal)   Ht 5' 7.5" (1.715 m)   Wt 178 lb (80.7 kg)   LMP 03/23/1994 (Approximate)   BMI 27.47 kg/m     General appearance: alert, cooperative and appears stated age Abdomen: soft, non-tender; bowel sounds normal; no masses,  no organomegaly Lymph:  no inguinal LAD noted  Pelvic: External genitalia:  no lesions              Urethra:  normal appearing urethra with no masses, tenderness or lesions              Bartholins and Skenes: normal                 Vagina: normal appearing vagina with normal color and discharge, no lesions              Cervix: no lesions              Bimanual Exam:  Uterus:  normal size, contour, position, consistency, mobility, non-tender              Adnexa: no mass, fullness, tenderness              Rectovaginal: Yes.  .  Confirms.              Anus:  normal sphincter tone, hemorrhoids  Endometrial biopsy recommended.  Discussed with patient.  Verbal and written consent obtained.   Procedure:  Speculum placed.  Cervix visualized and Pap obtained.  Cervix cleansed x 3 with Hibiclens.  A single toothed tenaculum was applied to the anterior lip of the cervix.  Endometrial pipelle was advanced through the cervix into the endometrial cavity without difficulty.  Pipelle passed to 7cm.  Suction applied and pipelle removed with good tissue sample obtained.  Two passes were performed.  Tenculum removed.  No bleeding noted.  Patient tolerated procedure well.  Chaperone was present for exam.  Assessment: PMP bleeding  Plan: Pap smear obtained and endometrial biopsy obtained.  results and recommendations will be called to pt

## 2018-08-16 NOTE — Patient Instructions (Signed)

## 2018-08-16 NOTE — Telephone Encounter (Signed)
Spoke with patient. Patient is postmenopausal, hx of laparoscopic BSO. Reports spotting for the 4 days. Notices spotting after BM. Has been straining with BMS, taking colace daily. Is sure bleeding is coming from vagina. Take ASA daily. Denies pain or urinary symptoms. Covod 19 prescreen negative. OV scheduled for today at 3pm with Dr. Sabra Heck. Patient verbalizes understanding and is agreeable.   Encounter closed.

## 2018-08-16 NOTE — Telephone Encounter (Signed)
Patient stating that she is experiencing PMB. She has been spotting for 4 days. Patient stated that it only happens after she has used the bathroom.

## 2018-08-18 LAB — CYTOLOGY - PAP: Diagnosis: NEGATIVE

## 2018-08-19 NOTE — Addendum Note (Signed)
Addended by: Megan Salon on: 08/19/2018 12:42 PM   Modules accepted: Orders

## 2018-08-23 ENCOUNTER — Other Ambulatory Visit: Payer: Self-pay

## 2018-08-25 ENCOUNTER — Other Ambulatory Visit: Payer: Medicare Other

## 2018-08-25 ENCOUNTER — Other Ambulatory Visit: Payer: Self-pay

## 2018-08-25 ENCOUNTER — Ambulatory Visit (INDEPENDENT_AMBULATORY_CARE_PROVIDER_SITE_OTHER): Payer: Medicare Other

## 2018-08-25 ENCOUNTER — Ambulatory Visit (INDEPENDENT_AMBULATORY_CARE_PROVIDER_SITE_OTHER): Payer: Medicare Other | Admitting: Obstetrics & Gynecology

## 2018-08-25 ENCOUNTER — Encounter: Payer: Self-pay | Admitting: Obstetrics & Gynecology

## 2018-08-25 VITALS — BP 132/80 | HR 76 | Temp 98.1°F | Ht 67.5 in | Wt 179.0 lb

## 2018-08-25 DIAGNOSIS — N950 Postmenopausal bleeding: Secondary | ICD-10-CM

## 2018-08-25 NOTE — Progress Notes (Signed)
73 y.o. G2P2 Married White or Caucasian female here for pelvic ultrasound due to PMP bleeding.  Endometrial biopsy was obtained on 08/16/2018 after having spotting/bleeding for about four days.  The bleeding started after having significant constipation and straining.  Bleeding has now stopped.  Patient's last menstrual period was 03/23/1994 (approximate).  Contraception: PMP  Findings:  UTERUS: 6.4 x 4.3 x 3.2cm EMS: 3.79mm, symmetric ADNEXA: Left ovary: surgically absent       Right ovary: surgically absent CUL DE SAC: no free fluid  Discussion:  Findings reviewed.  As pathology was negative, endometrium is symmetric and thin and bleeding has stopped, I feel it is ok to monitor.  If this happens again, I would recommend proceeding with hysteroscopy and D&C at that time.  Reviewed with pt and she is comfortable with plan.  Assessment:  PMP bleeding with negative endometrial biopsy and normal appearing endometrium on ultrasound  Plan:  Pt is going to monitor symptoms for now.  If bleeding occurs again, she will call and I will proceed with endometrial biopsy.  ~15 minutes spent with patient >50% of time was in face to face discussion of above.  '

## 2018-09-08 ENCOUNTER — Other Ambulatory Visit: Payer: Self-pay | Admitting: Family Medicine

## 2018-09-08 DIAGNOSIS — I10 Essential (primary) hypertension: Secondary | ICD-10-CM

## 2018-09-28 ENCOUNTER — Telehealth: Payer: Self-pay

## 2018-09-28 NOTE — Telephone Encounter (Signed)
Pt nurse called to report poor circulation in lower extremities. Her in home test shows left leg was 0.86 and right leg was 0.87 which results in poor circulation. Please advise. I will call Heather back with update [phone]. [place]

## 2018-09-28 NOTE — Telephone Encounter (Signed)
Have him set up an appointment with me in the office

## 2018-09-29 NOTE — Telephone Encounter (Signed)
appt was made KH 

## 2018-10-04 ENCOUNTER — Other Ambulatory Visit: Payer: Self-pay

## 2018-10-04 ENCOUNTER — Encounter: Payer: Self-pay | Admitting: Family Medicine

## 2018-10-04 ENCOUNTER — Ambulatory Visit: Payer: Medicare Other | Admitting: Family Medicine

## 2018-10-04 VITALS — BP 128/82 | HR 74 | Temp 98.4°F | Wt 180.4 lb

## 2018-10-04 DIAGNOSIS — Z0000 Encounter for general adult medical examination without abnormal findings: Secondary | ICD-10-CM

## 2018-10-04 NOTE — Progress Notes (Signed)
   Subjective:    Patient ID: Tina Owen, female    DOB: May 12, 1945, 73 y.o.   MRN: [id number]  HPI She is here for consult concerning questionable lower extremity abnormal ABI.  This was done as part of her insurance plan and it did come back with results that were questionable.  She does not complain of leg pain, skin changes, exertional issues with her extremities.  She does not smoke.  Her medications have not changed.   Review of Systems     Objective:   Physical Exam Alert and in no distress.  Exam of her lower extremities shows 1+ pulses.  Skin is normal.  Reflexes normal.  Good capillary refill.      Assessment & Plan:   Encounter Diagnosis  Name Primary?  . Normal exam Yes  I explained that I did not think she had any evidence of peripheral vascular disease.  She was comfortable with that.

## 2019-02-08 LAB — HM MAMMOGRAPHY

## 2019-03-09 ENCOUNTER — Other Ambulatory Visit: Payer: Self-pay | Admitting: Family Medicine

## 2019-03-09 DIAGNOSIS — E039 Hypothyroidism, unspecified: Secondary | ICD-10-CM

## 2019-03-09 DIAGNOSIS — I10 Essential (primary) hypertension: Secondary | ICD-10-CM

## 2019-03-09 DIAGNOSIS — E785 Hyperlipidemia, unspecified: Secondary | ICD-10-CM

## 2019-03-16 ENCOUNTER — Other Ambulatory Visit: Payer: Self-pay

## 2019-03-16 ENCOUNTER — Ambulatory Visit (HOSPITAL_COMMUNITY): Payer: Medicare Other | Attending: Internal Medicine

## 2019-03-16 DIAGNOSIS — I340 Nonrheumatic mitral (valve) insufficiency: Secondary | ICD-10-CM | POA: Diagnosis not present

## 2019-03-20 ENCOUNTER — Telehealth: Payer: Self-pay | Admitting: Family Medicine

## 2019-03-20 ENCOUNTER — Ambulatory Visit: Payer: Medicare Other | Admitting: Family Medicine

## 2019-03-20 ENCOUNTER — Other Ambulatory Visit: Payer: Self-pay

## 2019-03-20 ENCOUNTER — Encounter: Payer: Self-pay | Admitting: Family Medicine

## 2019-03-20 VITALS — BP 130/78 | HR 95 | Temp 97.8°F | Ht 67.0 in | Wt 179.4 lb

## 2019-03-20 DIAGNOSIS — I340 Nonrheumatic mitral (valve) insufficiency: Secondary | ICD-10-CM

## 2019-03-20 DIAGNOSIS — Z8249 Family history of ischemic heart disease and other diseases of the circulatory system: Secondary | ICD-10-CM

## 2019-03-20 DIAGNOSIS — M8580 Other specified disorders of bone density and structure, unspecified site: Secondary | ICD-10-CM | POA: Diagnosis not present

## 2019-03-20 DIAGNOSIS — Z85118 Personal history of other malignant neoplasm of bronchus and lung: Secondary | ICD-10-CM

## 2019-03-20 DIAGNOSIS — Z0000 Encounter for general adult medical examination without abnormal findings: Secondary | ICD-10-CM | POA: Diagnosis not present

## 2019-03-20 DIAGNOSIS — Z9849 Cataract extraction status, unspecified eye: Secondary | ICD-10-CM

## 2019-03-20 DIAGNOSIS — J301 Allergic rhinitis due to pollen: Secondary | ICD-10-CM

## 2019-03-20 DIAGNOSIS — E782 Mixed hyperlipidemia: Secondary | ICD-10-CM | POA: Diagnosis not present

## 2019-03-20 DIAGNOSIS — I351 Nonrheumatic aortic (valve) insufficiency: Secondary | ICD-10-CM

## 2019-03-20 DIAGNOSIS — E785 Hyperlipidemia, unspecified: Secondary | ICD-10-CM

## 2019-03-20 DIAGNOSIS — E039 Hypothyroidism, unspecified: Secondary | ICD-10-CM

## 2019-03-20 DIAGNOSIS — I10 Essential (primary) hypertension: Secondary | ICD-10-CM

## 2019-03-20 LAB — POCT URINALYSIS DIP (PROADVANTAGE DEVICE)
Bilirubin, UA: NEGATIVE
Blood, UA: NEGATIVE
Glucose, UA: NEGATIVE mg/dL
Ketones, POC UA: NEGATIVE mg/dL
Nitrite, UA: POSITIVE — AB
Protein Ur, POC: NEGATIVE mg/dL
Specific Gravity, Urine: 1.025
Urobilinogen, Ur: 0.2
pH, UA: 6 (ref 5.0–8.0)

## 2019-03-20 MED ORDER — LEVOTHYROXINE SODIUM 100 MCG PO TABS: ORAL_TABLET | ORAL | 3 refills | Status: DC

## 2019-03-20 MED ORDER — ATORVASTATIN CALCIUM 80 MG PO TABS: ORAL_TABLET | ORAL | 3 refills | Status: DC

## 2019-03-20 NOTE — Progress Notes (Signed)
Tina Owen is a 73 y.o. female who presents for annual wellness visit and follow-up on chronic medical conditions.  She has a history of hypothyroidism and is on Synthroid.  She has no difficulties with that medication.  She also continues on Bystolic as well as chlorthalidone for her blood pressure and underlying cardiac issues.  She is scheduled to see her cardiologist in the near future.  She does complain of some slight difficulty with becoming short of breath going up hill or stairs but states that she has had no chest pain, PND.  She admits to being deconditioned.  She also states that she feels some discomfort in her legs when she goes up hills or stairs but says it goes away fairly quickly.  She continues on atorvastatin is having no aches or pains.  She does have a history of osteopenia and has had a DEXA within the last 2 years.  Her allergies are under good control.  She sees her ophthalmologist regularly.  Social and family history is unchanged.  She and her husband are now retired.  Immunizations and Health Maintenance Immunization History  Administered Date(s) Administered  . DTaP 01/01/1986  . Influenza Split 02/05/2011, 01/19/2013  . Influenza-Unspecified 12/18/2013, 12/21/2014, 12/24/2015, 01/25/2017, 01/04/2018  . Pneumococcal Conjugate-13 03/11/2017  . Pneumococcal Polysaccharide-23 12/11/2005, 02/08/2012  . Tdap 02/03/2008, 02/05/2011  . Zoster 05/11/2006  . Zoster Recombinat (Shingrix) 07/13/2016   Health Maintenance Due  Topic Date Due  . INFLUENZA VACCINE  10/22/2018    Last Pap smear: 08/2018 Dr.Miller  Last mammogram: 02-08-19 Last colonoscopy:05-09-18 Last DEXA: 1-2-120 Dentist: q three months Ophtho: 11/2018 Dr. Gershon Owen Exercise: walking   Other doctors caring for patient include:Dr. Sabra Owen gyn, Dr. Gershon Owen eye, Dr. Debara Owen cardio, Dr. Henrene Owen GI  Advanced directives: on file  Does Patient Have a Medical Advance Directive?: Yes Type of Advance Directive:  [place] will Does patient want to make changes to medical advance directive?: No - Patient declined Copy of [place] in Chart?: Yes - validated most recent copy scanned in chart (See row information)  Depression screen:  See questionnaire below.  Depression screen [facility] 2/9 03/20/2019 03/18/2018 03/11/2017 02/26/2016 02/20/2015  Decreased Interest 0 0 0 0 0  Down, Depressed, Hopeless 0 0 0 0 0  PHQ - 2 Score 0 0 0 0 0    Fall Risk Screen: see questionnaire below. Fall Risk  03/20/2019 03/18/2018 03/11/2017 02/26/2016 02/20/2015  Falls in the past year? 0 0 No No No    ADL screen:  See questionnaire below Functional Status Survey: Is the patient deaf or have difficulty hearing?: No Does the patient have difficulty seeing, even when wearing glasses/contacts?: No Does the patient have difficulty concentrating, remembering, or making decisions?: No Does the patient have difficulty walking or climbing stairs?: Yes(out of breath walking up hill, had lung cancer) Does the patient have difficulty dressing or bathing?: No Does the patient have difficulty doing errands alone such as visiting a doctor's office or shopping?: No   Review of Systems Constitutional: -, -unexpected weight change, -anorexia, -fatigue Allergy: -sneezing, -itching, -congestion Dermatology: denies changing moles, rash, lumps ENT: -runny nose, -ear pain, -sore throat,  Cardiology:  -chest pain, -palpitations, -orthopnea, Respiratory: -cough, -shortness of breath, -dyspnea on exertion, -wheezing,  Gastroenterology: -abdominal pain, -nausea, -vomiting, -diarrhea, -constipation, -dysphagia Hematology: -bleeding or bruising problems Musculoskeletal: -arthralgias, -myalgias, -joint swelling, -back pain, - Ophthalmology: -vision changes,  Urology: -dysuria, -difficulty urinating,  -urinary frequency, -urgency, incontinence  Neurology: -, -numbness, , -memory loss, -falls,  -dizziness    PHYSICAL EXAM:  BP 130/78 (BP Location: Left Arm, Patient Position: Sitting)   Pulse 95   Temp 97.8 F (36.6 C)   Ht 5\' 7"  (1.702 m)   Wt 179 lb 6.4 oz (81.4 kg)   LMP 03/23/1994 (Approximate)   SpO2 (!) 66%   BMI 28.10 kg/m   General Appearance: Alert, cooperative, no distress, appears stated age Head: Normocephalic, without obvious abnormality, atraumatic Eyes: PERRL, conjunctiva/corneas clear, EOM's intact,  Ears: Normal TM's and external ear canals Nose: Nares normal, mucosa normal, no drainage or sinus tenderness Throat: Lips, mucosa, and tongue normal; teeth and gums normal Neck: Supple, no lymphadenopathy;  thyroid:  no enlargement/tenderness/nodules; no carotid bruit or JVD Lungs: Clear to auscultation bilaterally without wheezes, rales or ronchi; respirations unlabored Heart: Regular rate and rhythm, harsh midsystolic murmur is heard with a loud S2 Abdomen: Soft, non-tender, nondistended, normoactive bowel sounds,  no masses, no hepatosplenomegaly Extremities: No clubbing, cyanosis or edema Skin:  Skin color, texture, turgor normal, no rashes or lesions Lymph nodes: Cervical, supraclavicular, and axillary nodes normal Neurologic:  CNII-XII intact, normal strength, sensation and gait; reflexes 2+ and symmetric throughout Psych: Normal mood, affect, hygiene and grooming.  ASSESSMENT/PLAN: Routine general medical examination at a health care facility - Plan: CBC with Differential, Comprehensive metabolic panel, Lipid panel, POCT Urinalysis DIP (Proadvantage Device)  Nonrheumatic aortic valve insufficiency  Mixed hyperlipidemia - Plan: Lipid panel  Mitral valve insufficiency, unspecified etiology  Osteopenia, unspecified location  Allergic rhinitis due to pollen, unspecified seasonality  Family history of heart disease in female family member before age 37  History of cataract extraction, unspecified laterality  History of lung cancer  Essential  hypertension - Plan: CBC with Differential, Comprehensive metabolic panel  Hypothyroidism, unspecified type - Plan: TSH, levothyroxine (SYNTHROID) 100 MCG tablet  Hyperlipidemia with target LDL less than 100 - Plan: atorvastatin (LIPITOR) 80 MG tablet She will follow up with cardiology.  Encouraged her to become more physically active.  She plans to discuss the shortness of breath with cardiology.  I suspect deconditioning may be the main culprit.  I explained that to her.   Immunization recommendations discussed.  Colonoscopy recommendations reviewed   Medicare Attestation I have personally reviewed: The patient's medical and social history Their use of alcohol, tobacco or illicit drugs Their current medications and supplements The patient's functional ability including ADLs,fall risks, home safety risks, cognitive, and hearing and visual impairment Diet and physical activities Evidence for depression or mood disorders  The patient's weight, height, and BMI have been recorded in the chart.  I have made referrals, counseling, and provided education to the patient based on review of the above and I have provided the patient with a written personalized care plan for preventive services.     Jill Alexanders, MD   03/20/2019  [name] is a 73 y.o. female who presents for annual wellness visit and follow-up on chronic medical conditions.  She has the following concerns:  Immunizations and Health Maintenance Immunization History  Administered Date(s) Administered  . DTaP 01/01/1986  . Influenza Split 02/05/2011, 01/19/2013  . Influenza-Unspecified 12/18/2013, 12/21/2014, 12/24/2015, 01/25/2017, 01/04/2018  . Pneumococcal Conjugate-13 03/11/2017  . Pneumococcal Polysaccharide-23 12/11/2005, 02/08/2012  . Tdap 02/03/2008, 02/05/2011  . Zoster 05/11/2006  . Zoster Recombinat (Shingrix) 07/13/2016   Health Maintenance Due  Topic Date Due  . INFLUENZA VACCINE  10/22/2018    Last  Pap smear: Last mammogram: Last  colonoscopy: Last DEXA: Dentist: Ophtho: Exercise:  Other doctors caring for patient include:  Advanced directives: Does Patient Have a Medical Advance Directive?: Yes Type of Advance Directive: Healthcare Power of Attorney, Living will Does patient want to make changes to medical advance directive?: No - Patient declined Copy of [place] in Chart?: Yes - validated most recent copy scanned in chart (See row information)  Depression screen:  See questionnaire below.  Depression screen [facility] 2/9 03/20/2019 03/18/2018 03/11/2017 02/26/2016 02/20/2015  Decreased Interest 0 0 0 0 0  Down, Depressed, Hopeless 0 0 0 0 0  PHQ - 2 Score 0 0 0 0 0    Fall Risk Screen: see questionnaire below. Fall Risk  03/20/2019 03/18/2018 03/11/2017 02/26/2016 02/20/2015  Falls in the past year? 0 0 No No No    ADL screen:  See questionnaire below Functional Status Survey: Is the patient deaf or have difficulty hearing?: No Does the patient have difficulty seeing, even when wearing glasses/contacts?: No Does the patient have difficulty concentrating, remembering, or making decisions?: No Does the patient have difficulty walking or climbing stairs?: Yes(out of breath walking up hill, had lung cancer) Does the patient have difficulty dressing or bathing?: No Does the patient have difficulty doing errands alone such as visiting a doctor's office or shopping?: No   Review of Systems Constitutional: -, -unexpected weight change, -anorexia, -fatigue Allergy: -sneezing, -itching, -congestion Dermatology: denies changing moles, rash, lumps ENT: -runny nose, -ear pain, -sore throat,  Cardiology:  -chest pain, -palpitations, -orthopnea, Respiratory: -cough, -shortness of breath, -dyspnea on exertion, -wheezing,  Gastroenterology: -abdominal pain, -nausea, -vomiting, -diarrhea, -constipation, -dysphagia Hematology: -bleeding or bruising  problems Musculoskeletal: -arthralgias, -myalgias, -joint swelling, -back pain, - Ophthalmology: -vision changes,  Urology: -dysuria, -difficulty urinating,  -urinary frequency, -urgency, incontinence Neurology: -, -numbness, , -memory loss, -falls, -dizziness    PHYSICAL EXAM:  BP 130/78 (BP Location: Left Arm, Patient Position: Sitting)   Pulse 95   Temp 97.8 F (36.6 C)   Ht 5\' 7"  (1.702 m)   Wt 179 lb 6.4 oz (81.4 kg)   LMP 03/23/1994 (Approximate)   SpO2 (!) 66%   BMI 28.10 kg/m   General Appearance: Alert, cooperative, no distress, appears stated age Head: Normocephalic, without obvious abnormality, atraumatic Eyes: PERRL, conjunctiva/corneas clear, EOM's intact, fundi benign Ears: Normal TM's and external ear canals Nose: Nares normal, mucosa normal, no drainage or sinus tenderness Throat: Lips, mucosa, and tongue normal; teeth and gums normal Neck: Supple, no lymphadenopathy;  thyroid:  no enlargement/tenderness/nodules; no carotid bruit or JVD Lungs: Clear to auscultation bilaterally without wheezes, rales or ronchi; respirations unlabored Heart: Regular rate and rhythm, S1 and S2 normal, no murmur, rubor gallop Abdomen: Soft, non-tender, nondistended, normoactive bowel sounds,  no masses, no hepatosplenomegaly Extremities: No clubbing, cyanosis or edema Pulses: 2+ and symmetric all extremities Skin:  Skin color, texture, turgor normal, no rashes or lesions Lymph nodes: Cervical, supraclavicular, and axillary nodes normal Neurologic:  CNII-XII intact, normal strength, sensation and gait; reflexes 2+ and symmetric throughout Psych: Normal mood, affect, hygiene and grooming.  ASSESSMENT/PLAN:    Discussed monthly self breast exams and yearly mammograms; at least 30 minutes of aerobic activity at least 5 days/week and weight-bearing exercise 2x/week; proper sunscreen use reviewed; healthy diet, including goals of calcium and vitamin D intake and alcohol  recommendations (less than or equal to 1 drink/day) reviewed; regular seatbelt use; changing batteries in smoke detectors.  Immunization recommendations discussed.  Colonoscopy recommendations reviewed  Medicare Attestation I have personally reviewed: The patient's medical and social history Their use of alcohol, tobacco or illicit drugs Their current medications and supplements The patient's functional ability including ADLs,fall risks, home safety risks, cognitive, and hearing and visual impairment Diet and physical activities Evidence for depression or mood disorders  The patient's weight, height, and BMI have been recorded in the chart.  I have made referrals, counseling, and provided education to the patient based on review of the above and I have provided the patient with a written personalized care plan for preventive services.     Jill Alexanders, MD   03/20/2019

## 2019-03-20 NOTE — Telephone Encounter (Signed)
Pt called with dates of shingles vaccine. She received at [facility] when they were [facility]. Dates are 07/13/2016 and 11/04/2016. Per pt Pharmacy states information is in [facility] registry.

## 2019-03-20 NOTE — Patient Instructions (Signed)
  Tina Owen , Thank you for taking time to come for your Medicare Wellness Visit. I appreciate your ongoing commitment to your health goals. Please review the following plan we discussed and let me know if I can assist you in the future.   These are the goals we discussed: Goals   None     This is a list of the screening recommended for you and due dates:  Health Maintenance  Topic Date Due  . Flu Shot  10/22/2018  . Tetanus Vaccine  02/04/2021  . Mammogram  02/07/2021  . Colon Cancer Screening  05/10/2023  . DEXA scan (bone density measurement)  Completed  .  Hepatitis C: One time screening is recommended by Center for Disease Control  (CDC) for  adults born from [birth year] through 1966.   Completed  . Pneumonia vaccines  Completed

## 2019-03-21 LAB — COMPREHENSIVE METABOLIC PANEL
ALT: 17 IU/L (ref 0–32)
AST: 19 IU/L (ref 0–40)
Albumin/Globulin Ratio: 2 (ref 1.2–2.2)
Albumin: 4.5 g/dL (ref 3.7–4.7)
Alkaline Phosphatase: 73 IU/L (ref 39–117)
BUN/Creatinine Ratio: 17 (ref 12–28)
BUN: 18 mg/dL (ref 8–27)
Bilirubin Total: 0.8 mg/dL (ref 0.0–1.2)
CO2: 25 mmol/L (ref 20–29)
Calcium: 10.4 mg/dL — ABNORMAL HIGH (ref 8.7–10.3)
Chloride: 103 mmol/L (ref 96–106)
Creatinine, Ser: 1.08 mg/dL — ABNORMAL HIGH (ref 0.57–1.00)
GFR calc Af Amer: 58 mL/min/{1.73_m2} — ABNORMAL LOW (ref >59)
GFR calc non Af Amer: 51 mL/min/{1.73_m2} — ABNORMAL LOW (ref >59)
Globulin, Total: 2.3 g/dL (ref 1.5–4.5)
Glucose: 112 mg/dL — ABNORMAL HIGH (ref 65–99)
Potassium: 4.1 mmol/L (ref 3.5–5.2)
Sodium: 142 mmol/L (ref 134–144)
Total Protein: 6.8 g/dL (ref 6.0–8.5)

## 2019-03-21 LAB — CBC WITH DIFFERENTIAL/PLATELET
Basophils Absolute: 0.1 10*3/uL (ref 0.0–0.2)
Basos: 1 %
EOS (ABSOLUTE): 0.1 10*3/uL (ref 0.0–0.4)
Eos: 2 %
Hematocrit: 43.2 % (ref 34.0–46.6)
Hemoglobin: 15.1 g/dL (ref 11.1–15.9)
Immature Grans (Abs): 0 10*3/uL (ref 0.0–0.1)
Immature Granulocytes: 1 %
Lymphocytes Absolute: 1.5 10*3/uL (ref 0.7–3.1)
Lymphs: 23 %
MCH: 31.3 pg (ref 26.6–33.0)
MCHC: 35 g/dL (ref 31.5–35.7)
MCV: 90 fL (ref 79–97)
Monocytes Absolute: 0.7 10*3/uL (ref 0.1–0.9)
Monocytes: 10 %
Neutrophils Absolute: 4.2 10*3/uL (ref 1.4–7.0)
Neutrophils: 63 %
Platelets: 180 10*3/uL (ref 150–450)
RBC: 4.82 x10E6/uL (ref 3.77–5.28)
RDW: 12.5 % (ref 11.7–15.4)
WBC: 6.6 10*3/uL (ref 3.4–10.8)

## 2019-03-21 LAB — LIPID PANEL
Chol/HDL Ratio: 3.3 ratio (ref 0.0–4.4)
Cholesterol, Total: 212 mg/dL — ABNORMAL HIGH (ref 100–199)
HDL: 65 mg/dL (ref >39)
LDL Chol Calc (NIH): 122 mg/dL — ABNORMAL HIGH (ref 0–99)
Triglycerides: 141 mg/dL (ref 0–149)
VLDL Cholesterol Cal: 25 mg/dL (ref 5–40)

## 2019-03-21 LAB — TSH: TSH: 3.66 u[IU]/mL (ref 0.450–4.500)

## 2019-03-21 NOTE — Telephone Encounter (Signed)
Will be put in [place] and we will be able to update from there. [place]

## 2019-03-23 LAB — HGB A1C W/O EAG: Hgb A1c MFr Bld: 5.6 % (ref 4.8–5.6)

## 2019-03-23 LAB — SPECIMEN STATUS REPORT

## 2019-04-17 ENCOUNTER — Other Ambulatory Visit: Payer: Self-pay

## 2019-04-17 ENCOUNTER — Encounter: Payer: Self-pay | Admitting: Internal Medicine

## 2019-04-17 ENCOUNTER — Ambulatory Visit: Payer: Medicare PPO | Attending: Internal Medicine

## 2019-04-17 ENCOUNTER — Ambulatory Visit: Payer: Medicare PPO | Admitting: Internal Medicine

## 2019-04-17 VITALS — BP 152/80 | HR 68 | Ht 67.5 in | Wt 177.0 lb

## 2019-04-17 DIAGNOSIS — E782 Mixed hyperlipidemia: Secondary | ICD-10-CM

## 2019-04-17 DIAGNOSIS — I340 Nonrheumatic mitral (valve) insufficiency: Secondary | ICD-10-CM | POA: Diagnosis not present

## 2019-04-17 DIAGNOSIS — Z23 Encounter for immunization: Secondary | ICD-10-CM | POA: Insufficient documentation

## 2019-04-17 DIAGNOSIS — I10 Essential (primary) hypertension: Secondary | ICD-10-CM | POA: Diagnosis not present

## 2019-04-17 MED ORDER — EZETIMIBE 10 MG PO TABS: 10.0000 mg | ORAL_TABLET | Freq: Every day | ORAL | 3 refills | Status: DC

## 2019-04-17 NOTE — Progress Notes (Signed)
OFFICE NOTE  Chief Complaint:  Routine follow-up  Primary Care Physician: Denita Lung, MD  HPI:  Tina Owen is a pleasant 74 year old female previously followed by Dr. Rex Kras with a history of lung cancer status post radiation and chemotherapy. She also has a history of pericardial effusion which she developed almost 12 years after this episode. She underwent pericardial window and 2010 and has had no reoccurrence. She is on Lipitor for dyslipidemia without any issues and takes low-dose Bystolic. She has been exercising recently without any symptoms such as shortness of breath, worsening chest pain, presyncope or syncopal symptoms.  Tina Owen returns today for follow-up. She reports doing fairly well. She denies any chest pain or worsening shortness of breath. She does get a little short of breath particularly when walking up stairs and recently was in [place] and climbed up 4 flights of stairs and became short of breath. That however resolved quickly.  She had a recent echocardiogram which showed a preserved EF of 55-60%, mild mitral regurgitation which is stable, and no recurrent pericardial effusion. She is status post pericardial window. Blood pressure has been reportedly well controlled at home.  I saw Tina Owen back today in the office. She reports some mild shortness of breath is not necessarily any worse than it had been previously. Blood pressure is very well controlled. Her weight is stable. She denies any chest pain.  02/19/2016  Tina Owen was seen back in the office today. She had a recent echo which shows no change in her mild degree of AI and MR, with a preserved LVEF. She reports possibly worsening DOE- LV filling pressures are elevated on her echo. She denies chest pain. BP is elevated today as well at 166/86.  04/13/2016  Tina Owen was seen in the office today in follow-up. Overall she is doing very well. Previously I had started her on chlorthalidone  in addition to her medicines. Blood pressure today is very well controlled 118/82. She denies any worsening shortness of breath. She says after starting the medicine she did have some diuresis and lost some weight. BMI today is only 27. Overall she feels well. She recently went on a cruise and had no difficulty walking with or worsening shortness of breath going up and down stairs or climbing around the [employer] and [place].  04/21/2017  Tina Owen returns today for follow-up.  Overall she seems to be doing well.  Recently she has had some weight gain from 179 186 pounds.  She says she has been off of her chlorthalidone for the past 2 weeks because she ran out of medication.  This might explain a slight increase in her blood pressure today 140/80.  He denies any chest pain or shortness of breath.  EKG is normal sinus at 70.  04/11/2017  Tina Owen is seen today for routine follow-up.  Overall she is doing well.  She denies any chest pain or shortness of breath.  Blood pressure was normal today 132/80.  She has upcoming routine colonoscopy in February recently had wellness check with her primary care provider.  She is maintaining on daily chlorthalidone because of edema.  Her lipid profile was at goal.  04/17/2019  Tina Owen is seen today for annual follow-up.  Overall she is doing well.  She recently had a COVID-19 vaccine.  Unfortunately, her labs recently showed that her LDL cholesterol remains elevated at 122.  This is despite compliance with 80 mg atorvastatin.  She does  have some aortic sclerosis and aortic insufficiency by recent echo in December which showed normal LV function and no problems with recurrent pericardial effusion.  Her target LDL should be less than 70.  PMHx:  Past Medical History:  Diagnosis Date  . Allergy    SEASONAL  . Bilateral hip pain   . Blood transfusion without reported diagnosis    as an infant. 63 week old, 80  . Cancer ([place])    LUNG,  1998 - surgery, chemo, radiation  . Cataract   . Colonic polyp 2004  . Dyslipidemia   . Dyspnea   . FHx: cardiovascular disease   . Fibroid   . Heart murmur   . Hyperlipidemia   . Hypertension   . Hypothyroid   . Osteopenia     Past Surgical History:  Procedure Laterality Date  . BREAST CYST EXCISION Left    age 46-14  . CATARACT EXTRACTION, BILATERAL  08/2016  . CESAREAN SECTION  [id number]   x2  . CHOLECYSTECTOMY  1998  . COLONOSCOPY    . LAPAROSCOPIC BILATERAL SALPINGO OOPHERECTOMY N/A 09/29/2016   Procedure: LAPAROSCOPIC BILATERAL SALPINGO OOPHORECTOMY WITH WASHINGS;  Surgeon: Megan Salon, MD;  Location: [facility];  Service: Gynecology;  Laterality: N/A;  . LUNG CANCER SURGERY Left 1998   L upper lobectomy    . PERICARDIAL WINDOW  2010   Dr Arlyce Dice  . TRANSTHORACIC ECHOCARDIOGRAM  10/2010   EF>55%; LA mild-mod dilated; borderline RA englargement; mild MR, trace TR, mild AV regurg, trace PV regurg   . TUBAL LIGATION  1990    FAMHx:  Family History  Problem Relation Age of Onset  . Hypertension Mother   . Heart disease Mother   . Hypertension Father   . Heart disease Father   . Heart disease Maternal Grandmother   . Heart disease Maternal Grandfather   . Colon cancer Brother        this was a half brother  . Esophageal cancer Neg Hx   . Stomach cancer Neg Hx   . Rectal cancer Neg Hx     SOCHx:   reports that she quit smoking about 24 years ago. She has never used smokeless tobacco. She reports current alcohol use of about 7.0 standard drinks of alcohol per week. She reports that she does not use drugs.  ALLERGIES:  Allergies  Allergen Reactions  . Tape Itching    Redness and scars  PAPER TAPE IS OKAY    ROS: Pertinent items noted in HPI and remainder of comprehensive ROS otherwise negative.  HOME MEDS: Current Outpatient Medications  Medication Sig Dispense Refill  . aspirin EC 81 MG tablet Take 81 mg by mouth daily.    Marland Kitchen atorvastatin (LIPITOR) 80 MG  tablet TAKE 1 TABLET(80 MG) BY MOUTH DAILY 90 tablet 3  . BYSTOLIC 10 MG tablet TAKE 1 TABLET(10 MG) BY MOUTH DAILY 90 tablet 3  . calcium carbonate (OS-CAL) 600 MG TABS tablet Take by mouth.    . Calcium Carbonate-Vitamin D (CALCIUM PLUS VITAMIN D PO) Take 1 tablet by mouth at bedtime.    . chlorthalidone (HYGROTON) 25 MG tablet TAKE 1/2 TABLET(12.5 MG) BY MOUTH DAILY 45 tablet 1  . Cholecalciferol (VITAMIN D3) 2000 units TABS Take 2,000 scoop by mouth daily.    Marland Kitchen levothyroxine (SYNTHROID) 100 MCG tablet TAKE 1 TABLET(100 MCG) BY MOUTH DAILY 90 tablet 3  . Multiple Vitamin (MULTIVITAMIN WITH MINERALS) TABS tablet Take 1 tablet by mouth at bedtime.  No current facility-administered medications for this visit.    LABS/IMAGING: No results found for this or any previous visit (from the past 48 hour(s)). No results found.  VITALS: BP (!) 152/80   Pulse 68   Ht 5' 7.5" (1.715 m)   Wt 177 lb (80.3 kg)   LMP 03/23/1994 (Approximate)   SpO2 98%   BMI 27.31 kg/m   EXAM: General appearance: alert and no distress Neck: no adenopathy, no carotid bruit, no JVD, supple, symmetrical, trachea midline and thyroid not enlarged, symmetric, no tenderness/mass/nodules Lungs: clear to auscultation bilaterally Heart: regular rate and rhythm, S1, S2 normal and systolic murmur: early systolic 3/6, blowing at apex Abdomen: soft, non-tender; bowel sounds normal; no masses,  no organomegaly Extremities: extremities normal, atraumatic, no cyanosis or edema Pulses: 2+ and symmetric Skin: Skin color, texture, turgor normal. No rashes or lesions Neurologic: Grossly normal  EKG: Deferred  ASSESSMENT: 1. Remote pericardial effusion status post pericardial window - no reoccurence by echo in 10/2013 (EF 55-60%) 2. Mild mitral regurgitation, stable 3. Hyperlipidemia 4. Hypertension-controlled 5. History of lung cancer  PLAN: 1.   Tina Owen continues to do well and her echo shows normal LV function  with some trivial AI and mild mitral regurgitation.  The aortic valve is sclerotic.  Her target LDL is less than 70 and will need extra treatment.  Although she is working on dietary changes and weight loss, advise adding ezetimibe 10 mg daily to her current regimen.  Plan follow-up with me annually or sooner as necessary and will repeat labs in 3 months.  Pixie Casino, MD, [facility], [place] Director of the [facility]  [facility] Diplomate of the American Board of Clinical Lipidology Attending Cardiologist  Direct Dial: [phone]  Fax: [phone]  Website:  [email or web address] 04/17/2019, 4:05 PM

## 2019-04-17 NOTE — Patient Instructions (Signed)
Medication Instructions:  START zetia 10mg  daily  *If you need a refill on your cardiac medications before your next appointment, please call your pharmacy*  Lab Work: FASTING lab work in 3 months to check cholesterol   If you have labs (blood work) drawn today and your tests are completely normal, you will receive your results only by: Marland Kitchen MyChart Message (if you have MyChart) OR . A paper copy in the mail If you have any lab test that is abnormal or we need to change your treatment, we will call you to review the results.  Testing/Procedures: NONE  Follow-Up: At [facility], you and your health needs are our priority.  As part of our continuing mission to provide you with exceptional heart care, we have created designated Provider Care Teams.  These Care Teams include your primary Cardiologist (physician) and Advanced Practice Providers (APPs -  Physician Assistants and Nurse Practitioners) who all work together to provide you with the care you need, when you need it.  Your next appointment:   12 month(s)  The format for your next appointment:   In Person  Provider:   You may see Dr. Debara Pickett or one of the following Advanced Practice Providers on your designated Care Team:    Almyra Deforest, PA-C  Fabian Sharp, [place] or   Roby Lofts, [place]   Other Instructions

## 2019-04-17 NOTE — Progress Notes (Signed)
   Covid-19 Vaccination Clinic  Name:  JAMIEKA ROYLE    MRN: [id number] DOB: 06/03/1945  04/17/2019  Ms. Manzella was observed post Covid-19 immunization for 15 minutes without incidence. She was provided with Vaccine Information Sheet and instruction to access the V-Safe system.   Ms. Kurka was instructed to call 911 with any severe reactions post vaccine: Marland Kitchen Difficulty breathing  . Swelling of your face and throat  . A fast heartbeat  . A bad rash all over your body  . Dizziness and weakness    Immunizations Administered    Name Date Dose VIS Date Route   Pfizer COVID-19 Vaccine 04/17/2019 11:19 AM 0.3 mL 03/03/2019 Intramuscular   Manufacturer: [place]   Lot: [id number]   [employer]: [address]-3

## 2019-05-08 ENCOUNTER — Ambulatory Visit: Payer: Medicare PPO | Attending: Internal Medicine

## 2019-05-08 DIAGNOSIS — Z23 Encounter for immunization: Secondary | ICD-10-CM | POA: Insufficient documentation

## 2019-05-08 NOTE — Progress Notes (Signed)
   Covid-19 Vaccination Clinic  Name:  Tina Owen    MRN: [id number] DOB: 1946/03/02  05/08/2019  Tina Owen was observed post Covid-19 immunization for 15 minutes without incidence. She was provided with Vaccine Information Sheet and instruction to access the V-Safe system.   Tina Owen was instructed to call 911 with any severe reactions post vaccine: Marland Kitchen Difficulty breathing  . Swelling of your face and throat  . A fast heartbeat  . A bad rash all over your body  . Dizziness and weakness    Immunizations Administered    Name Date Dose VIS Date Route   Pfizer COVID-19 Vaccine 05/08/2019 11:35 AM 0.3 mL 03/03/2019 Intramuscular   Manufacturer: [place]   Lot: [id number]   [place]: [id number]

## 2019-07-20 DIAGNOSIS — E782 Mixed hyperlipidemia: Secondary | ICD-10-CM | POA: Diagnosis not present

## 2019-07-20 LAB — LIPID PANEL
Chol/HDL Ratio: 2.5 ratio (ref 0.0–4.4)
Cholesterol, Total: 152 mg/dL (ref 100–199)
HDL: 61 mg/dL (ref >39)
LDL Chol Calc (NIH): 73 mg/dL (ref 0–99)
Triglycerides: 99 mg/dL (ref 0–149)
VLDL Cholesterol Cal: 18 mg/dL (ref 5–40)

## 2019-07-26 ENCOUNTER — Ambulatory Visit: Payer: Medicare PPO | Admitting: Family Medicine

## 2019-07-26 ENCOUNTER — Other Ambulatory Visit: Payer: Self-pay

## 2019-07-26 ENCOUNTER — Ambulatory Visit
Admission: RE | Admit: 2019-07-26 | Discharge: 2019-07-26 | Disposition: A | Discharge status: Home / Self Care | Payer: Medicare PPO | Source: Ambulatory Visit | Attending: Family Medicine | Admitting: Family Medicine

## 2019-07-26 VITALS — BP 136/84 | HR 70 | Temp 97.8°F | Wt 166.2 lb

## 2019-07-26 DIAGNOSIS — M79671 Pain in right foot: Secondary | ICD-10-CM | POA: Diagnosis not present

## 2019-07-26 DIAGNOSIS — S92354A Nondisplaced fracture of fifth metatarsal bone, right foot, initial encounter for closed fracture: Secondary | ICD-10-CM | POA: Diagnosis not present

## 2019-07-26 NOTE — Progress Notes (Signed)
   Subjective:    Patient ID: Tina Owen, female    DOB: [birth year], 74 y.o.   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  She does not remember rolling over having a particular.  She was able to walk but did have a pain in the lateral foot area.   Review of Systems     Objective:   Physical Exam Swollen and tender over the proximal fifth metatarsal.  No tenderness over the ATF or lateral malleolus.       Assessment & Plan:  Right foot pain - Plan: DG Foot Complete Right Recommend ice for 20 minutes 3 times per day, Tylenol and then add up to 800 mg of ibuprofen 3 times per day for the pain.  Prescription for wooden shoe given as I think this will probably turn out to be a fracture.  Discussed healing and the possibility of nonhealing with her.  She will keep in touch on that.

## 2019-07-26 NOTE — Patient Instructions (Signed)
Ice for 20 minutes 3 times per day.  Please use Tylenol and then if you need something stronger than go with Advil .  You can take up to 4 Advil 3 times per day

## 2019-09-02 ENCOUNTER — Other Ambulatory Visit: Payer: Self-pay | Admitting: Family Medicine

## 2019-09-02 DIAGNOSIS — I10 Essential (primary) hypertension: Secondary | ICD-10-CM

## 2019-11-20 ENCOUNTER — Ambulatory Visit (HOSPITAL_COMMUNITY)
Admission: EM | Admit: 2019-11-20 | Discharge: 2019-11-20 | Disposition: A | Discharge status: Home / Self Care | Payer: Medicare PPO | Attending: Family Medicine | Admitting: Family Medicine

## 2019-11-20 ENCOUNTER — Other Ambulatory Visit: Payer: Self-pay

## 2019-11-20 ENCOUNTER — Encounter (HOSPITAL_COMMUNITY): Payer: Self-pay | Admitting: Emergency Medicine

## 2019-11-20 ENCOUNTER — Ambulatory Visit (INDEPENDENT_AMBULATORY_CARE_PROVIDER_SITE_OTHER): Payer: Medicare PPO

## 2019-11-20 DIAGNOSIS — Z23 Encounter for immunization: Secondary | ICD-10-CM | POA: Diagnosis not present

## 2019-11-20 DIAGNOSIS — S8262XA Displaced fracture of lateral malleolus of left fibula, initial encounter for closed fracture: Secondary | ICD-10-CM | POA: Diagnosis not present

## 2019-11-20 DIAGNOSIS — M25572 Pain in left ankle and joints of left foot: Secondary | ICD-10-CM

## 2019-11-20 DIAGNOSIS — M25472 Effusion, left ankle: Secondary | ICD-10-CM

## 2019-11-20 DIAGNOSIS — S82839A Other fracture of upper and lower end of unspecified fibula, initial encounter for closed fracture: Secondary | ICD-10-CM | POA: Diagnosis not present

## 2019-11-20 DIAGNOSIS — S82832A Other fracture of upper and lower end of left fibula, initial encounter for closed fracture: Secondary | ICD-10-CM | POA: Diagnosis not present

## 2019-11-20 MED ORDER — TETANUS-DIPHTH-ACELL PERTUSSIS 5-2.5-18.5 LF-MCG/0.5 IM SUSP
0.5000 mL | Freq: Once | INTRAMUSCULAR | Status: AC
  Administered 2019-11-20: 0.5 mL via INTRAMUSCULAR

## 2019-11-20 MED ORDER — TETANUS-DIPHTH-ACELL PERTUSSIS 5-2.5-18.5 LF-MCG/0.5 IM SUSP
INTRAMUSCULAR | Status: AC
  Filled 2019-11-20: qty 0.5

## 2019-11-20 NOTE — ED Triage Notes (Signed)
Patient was walking her dog this evening.  Dog suddenly took off in a different direction than anticipated .  Patient did fall on concrete.  Abrasion to right hand right knee sore and left ankle is swollen and painful.  extension of left foot is painful

## 2019-11-20 NOTE — ED Provider Notes (Signed)
[facility]    CSN: [id number] Arrival date & time: 11/20/19  1931      History   Chief Complaint Chief Complaint  Patient presents with  . Ankle Pain    HPI Tina Owen is a 74 y.o. female.   HPI   Patient states that she tripped while walking her dog tonight and fell.  Abrasions on her right arm and elbow.  Abrasion on right knee.  Left ankle swollen and painful.  Swelling and pain.  Pain with weightbearing. Patient has  Known osteopenia.  Is on calcium and vitamin D. Had a recent fracture of her right foot metatarsal.   Past Medical History:  Diagnosis Date  . Allergy    SEASONAL  . Bilateral hip pain   . Blood transfusion without reported diagnosis    as an infant. 14 week old, 43  . Cancer ([place])    LUNG, 1998 - surgery, chemo, radiation  . Cataract   . Colonic polyp 2004  . Dyslipidemia   . Dyspnea   . FHx: cardiovascular disease   . Fibroid   . Heart murmur   . Hyperlipidemia   . Hypertension   . Hypothyroid   . Osteopenia     Patient Active Problem List   Diagnosis Date Noted  . History of cataract extraction 03/11/2017  . History of bilateral salpingo-oophorectomy 03/11/2017  . Mitral valve insufficiency 02/19/2016  . Nonrheumatic aortic valve insufficiency 02/19/2016  . Hypertension 02/05/2011  . Hypothyroid 02/05/2011  . History of lung cancer 02/05/2011  . Mixed hyperlipidemia 02/05/2011  . Osteopenia 02/05/2011  . Family history of heart disease in female family member before age 15 02/05/2011  . Allergic rhinitis 02/05/2011    Past Surgical History:  Procedure Laterality Date  . BREAST CYST EXCISION Left    age 96-14  . CATARACT EXTRACTION, BILATERAL  08/2016  . CESAREAN SECTION  [id number],[id number]   x2  . CHOLECYSTECTOMY  1998  . COLONOSCOPY    . LAPAROSCOPIC BILATERAL SALPINGO OOPHERECTOMY N/A 09/29/2016   Procedure: LAPAROSCOPIC BILATERAL SALPINGO OOPHORECTOMY WITH WASHINGS;  Surgeon: Megan Salon, MD;  Location: [facility];   Service: Gynecology;  Laterality: N/A;  . LUNG CANCER SURGERY Left 1998   L upper lobectomy    . PERICARDIAL WINDOW  2010   Dr Arlyce Dice  . TRANSTHORACIC ECHOCARDIOGRAM  10/2010   EF>55%; LA mild-mod dilated; borderline RA englargement; mild MR, trace TR, mild AV regurg, trace PV regurg   . TUBAL LIGATION  1990    OB History    Gravida  2   Para  2   Term      Preterm      AB      Living  2     SAB      TAB      Ectopic      Multiple      Live Births  2            Home Medications    Prior to Admission medications   Medication Sig Start Date End Date Taking? Authorizing Provider  aspirin EC 81 MG tablet Take 81 mg by mouth daily.   Yes [provider]  atorvastatin (LIPITOR) 80 MG tablet TAKE 1 TABLET(80 MG) BY MOUTH DAILY 03/20/19  Yes Denita Lung, MD  BYSTOLIC 10 MG tablet TAKE 1 TABLET(10 MG) BY MOUTH DAILY 03/09/19  Yes Denita Lung, MD  calcium carbonate (OS-CAL) 600 MG TABS tablet Take by  mouth.   Yes [provider]  Calcium Carbonate-Vitamin D (CALCIUM PLUS VITAMIN D PO) Take 1 tablet by mouth at bedtime.   Yes [provider]  chlorthalidone (HYGROTON) 25 MG tablet TAKE 1/2 TABLET(12.5 MG) BY MOUTH DAILY 09/04/19  Yes Denita Lung, MD  Cholecalciferol (VITAMIN D3) 2000 units TABS Take 2,000 scoop by mouth daily.   Yes [provider]  ibuprofen (ADVIL) 200 MG tablet Take 200 mg by mouth every 6 (six) hours as needed.   Yes [provider]  levothyroxine (SYNTHROID) 100 MCG tablet TAKE 1 TABLET(100 MCG) BY MOUTH DAILY 03/20/19  Yes Denita Lung, MD  Multiple Vitamin (MULTIVITAMIN WITH MINERALS) TABS tablet Take 1 tablet by mouth at bedtime.   Yes [provider]  ezetimibe (ZETIA) 10 MG tablet Take 1 tablet (10 mg total) by mouth daily. 04/17/19 07/16/19  Pixie Casino, MD    Family History Family History  Problem Relation Age of Onset  . Hypertension Mother   . Heart disease Mother     . Hypertension Father   . Heart disease Father   . Heart disease Maternal Grandmother   . Heart disease Maternal Grandfather   . Colon cancer Brother        this was a half brother  . Esophageal cancer Neg Hx   . Stomach cancer Neg Hx   . Rectal cancer Neg Hx     Social History Social History   Tobacco Use  . Smoking status: Former Smoker    Quit date: 03/24/1995    Years since quitting: 24.6  . Smokeless tobacco: Never Used  Substance Use Topics  . Alcohol use: Yes    Alcohol/week: 7.0 standard drinks    Types: 7 Glasses of wine per week  . Drug use: No     Allergies   Tape   Review of Systems Review of Systems See HPI  Physical Exam Triage Vital Signs ED Triage Vitals  Enc Vitals Group     BP 11/20/19 2109 (!) 149/72     Pulse Rate 11/20/19 2109 72     Resp 11/20/19 2109 18     Temp 11/20/19 2109 98.1 F (36.7 C)     Temp Source 11/20/19 2109 Oral     SpO2 11/20/19 2109 97 %     Weight --      Height --      Head Circumference --      Peak Flow --      Pain Score 11/20/19 2105 1     Pain Loc --      Pain Edu? --      Excl. in [place]? --    No data found.  Updated Vital Signs BP (!) 149/72 (BP Location: Left Arm)   Pulse 72   Temp 98.1 F (36.7 C) (Oral)   Resp 18   LMP 03/23/1994 (Approximate)   SpO2 97%       Physical Exam   UC Treatments / Results  Labs (all labs ordered are listed, but only abnormal results are displayed) Labs Reviewed - No data to display  EKG   Radiology DG Ankle Complete Left  Result Date: 11/20/2019 CLINICAL DATA:  [place] this evening, swelling EXAM: LEFT ANKLE COMPLETE - 3+ VIEW COMPARISON:  None. FINDINGS: Frontal, oblique, lateral views of the left ankle are obtained. There is a small transverse fracture at the tip of the lateral malleolus consistent with avulsion fracture. Marked overlying anterolateral soft tissue swelling. On  the lateral view there is a small ossific density at the base of the fifth  metatarsal, not seen on the additional views. Differential would include an avulsion fracture versus enthesopathic change. Dedicated views of the left foot may be useful if pain exists in this region. Ankle mortise is intact.  Joint spaces are well preserved. IMPRESSION: 1. Lateral malleolar avulsion fracture with overlying soft tissue swelling. 2. Small avulsion fracture versus enthesopathic change at the base of the fifth metatarsal. Please correlate with physical exam. If pain exists in this region, dedicated left foot series may be useful. Electronically Signed   By: Randa Ngo M.D.   On: 11/20/2019 21:30    Procedures Procedures (including critical care time)  Medications Ordered in UC Medications  Tdap (BOOSTRIX) injection 0.5 mL (has no administration in time range)    Initial Impression / Assessment and Plan / UC Course  I have reviewed the triage vital signs and the nursing notes.  Pertinent labs & imaging results that were available during my care of the patient were reviewed by me and considered in my medical decision making (see chart for details).     Avulsion fracture discussed.  Basically can be managed as a bad sprain.  She is placed into a Cam walking boot to follow-up with orthopedics Final Clinical Impressions(s) / UC Diagnoses   Final diagnoses:  Avulsion fracture of distal end of fibula  Acute left ankle pain     Discharge Instructions     Use ice and elevation to reduce swelling Wear boot at all times that you are up on your feet Follow-up with orthopedic.  Call tomorrow for appointment May take over-the-counter Tylenol, ibuprofen or Aleve for pain   ED Prescriptions    None     PDMP not reviewed this encounter.   Raylene Everts, MD 11/20/19 [phone]

## 2019-11-20 NOTE — Discharge Instructions (Addendum)
Use ice and elevation to reduce swelling Wear boot at all times that you are up on your feet Follow-up with orthopedic.  Call tomorrow for appointment May take over-the-counter Tylenol, ibuprofen or Aleve for pain

## 2019-11-28 DIAGNOSIS — M25572 Pain in left ankle and joints of left foot: Secondary | ICD-10-CM | POA: Diagnosis not present

## 2019-12-05 ENCOUNTER — Ambulatory Visit: Payer: Medicare PPO

## 2019-12-15 DIAGNOSIS — Z20822 Contact with and (suspected) exposure to covid-19: Secondary | ICD-10-CM | POA: Diagnosis not present

## 2019-12-26 ENCOUNTER — Ambulatory Visit: Payer: Medicare PPO

## 2020-01-02 ENCOUNTER — Ambulatory Visit: Payer: Medicare PPO

## 2020-01-09 ENCOUNTER — Ambulatory Visit: Payer: Medicare PPO | Attending: Internal Medicine

## 2020-01-09 DIAGNOSIS — Z23 Encounter for immunization: Secondary | ICD-10-CM

## 2020-01-09 NOTE — Progress Notes (Signed)
° °  Covid-19 Vaccination Clinic  Name:  ROSHONDA SPERL    MRN: [id number] DOB: 1945-08-01  01/09/2020  Ms. Fenter was observed post Covid-19 immunization for 15 minutes without incident. She was provided with Vaccine Information Sheet and instruction to access the V-Safe system.   Ms. Keeler was instructed to call 911 with any severe reactions post vaccine:  Difficulty breathing   Swelling of face and throat   A fast heartbeat   A bad rash all over body   Dizziness and weakness

## 2020-02-27 DIAGNOSIS — Z20822 Contact with and (suspected) exposure to covid-19: Secondary | ICD-10-CM | POA: Diagnosis not present

## 2020-03-03 ENCOUNTER — Other Ambulatory Visit: Payer: Self-pay | Admitting: Family Medicine

## 2020-03-03 DIAGNOSIS — I10 Essential (primary) hypertension: Secondary | ICD-10-CM

## 2020-03-04 ENCOUNTER — Other Ambulatory Visit: Payer: Self-pay | Admitting: Family Medicine

## 2020-03-04 DIAGNOSIS — E785 Hyperlipidemia, unspecified: Secondary | ICD-10-CM

## 2020-03-04 DIAGNOSIS — E039 Hypothyroidism, unspecified: Secondary | ICD-10-CM

## 2020-03-06 DIAGNOSIS — Z1231 Encounter for screening mammogram for malignant neoplasm of breast: Secondary | ICD-10-CM | POA: Diagnosis not present

## 2020-03-06 LAB — HM MAMMOGRAPHY

## 2020-03-21 ENCOUNTER — Encounter: Payer: Self-pay | Admitting: Family Medicine

## 2020-03-21 ENCOUNTER — Ambulatory Visit: Payer: Medicare PPO | Admitting: Family Medicine

## 2020-03-21 VITALS — BP 120/74 | HR 58 | Temp 98.1°F | Ht 67.5 in | Wt 159.0 lb

## 2020-03-21 DIAGNOSIS — I340 Nonrheumatic mitral (valve) insufficiency: Secondary | ICD-10-CM | POA: Diagnosis not present

## 2020-03-21 DIAGNOSIS — I351 Nonrheumatic aortic (valve) insufficiency: Secondary | ICD-10-CM

## 2020-03-21 DIAGNOSIS — Z8249 Family history of ischemic heart disease and other diseases of the circulatory system: Secondary | ICD-10-CM

## 2020-03-21 DIAGNOSIS — Z85118 Personal history of other malignant neoplasm of bronchus and lung: Secondary | ICD-10-CM | POA: Diagnosis not present

## 2020-03-21 DIAGNOSIS — I10 Essential (primary) hypertension: Secondary | ICD-10-CM | POA: Diagnosis not present

## 2020-03-21 DIAGNOSIS — Z0000 Encounter for general adult medical examination without abnormal findings: Secondary | ICD-10-CM | POA: Diagnosis not present

## 2020-03-21 DIAGNOSIS — J301 Allergic rhinitis due to pollen: Secondary | ICD-10-CM

## 2020-03-21 DIAGNOSIS — E782 Mixed hyperlipidemia: Secondary | ICD-10-CM

## 2020-03-21 DIAGNOSIS — Z9849 Cataract extraction status, unspecified eye: Secondary | ICD-10-CM | POA: Diagnosis not present

## 2020-03-21 DIAGNOSIS — E039 Hypothyroidism, unspecified: Secondary | ICD-10-CM

## 2020-03-21 DIAGNOSIS — M8580 Other specified disorders of bone density and structure, unspecified site: Secondary | ICD-10-CM

## 2020-03-21 DIAGNOSIS — H9113 Presbycusis, bilateral: Secondary | ICD-10-CM

## 2020-03-21 MED ORDER — EZETIMIBE 10 MG PO TABS: 10.0000 mg | ORAL_TABLET | Freq: Every day | ORAL | 3 refills | Status: DC

## 2020-03-21 NOTE — Progress Notes (Signed)
Tina Owen is a 74 y.o. female who presents for annual wellness visit,CPE and follow-up on chronic medical conditions.  She has no particular concerns or questions.  She does have hypertension and continues on Bystolic as well as chlorthalidone.  She is also taking Lipitor for her lipids.  Continues on Synthroid for her underlying hypothyroidism.  She does have a history of colonic polyps and is scheduled for routine follow-up concerning that.  She does have evidence of valvular disease probably secondary to her radiation from remote history of lung cancer in 1998.  She is followed regularly by cardiology.  She does have osteopenia and is taking multivitamin with extra calcium.  Her allergies are causing no difficulty.  She is retired and she and her husband are enjoying their retirement.  They are doing a lot of travel on cruises.  Immunizations and Health Maintenance Immunization History  Administered Date(s) Administered  . DTaP 01/01/1986  . Influenza Split 02/05/2011, 01/19/2013  . Influenza-Unspecified 12/18/2013, 12/21/2014, 12/24/2015, 01/25/2017, 01/04/2018  . PFIZER SARS-COV-2 Vaccination 04/17/2019, 05/08/2019, 01/09/2020  . Pneumococcal Conjugate-13 03/11/2017  . Pneumococcal Polysaccharide-23 12/11/2005, 02/08/2012  . Tdap 02/03/2008, 02/05/2011, 11/20/2019  . Zoster 05/11/2006  . Zoster Recombinat (Shingrix) 07/13/2016   Health Maintenance Due  Topic Date Due  . INFLUENZA VACCINE  10/22/2019    Last Pap smear: 2 years ago Last mammogram: 11/21 Last colonoscopy: 2020 Last DEXA: last year Dentist:Dr. Sabino Dick Ophtho: Dr. Gershon Crane Exercise: walking the dog daily   Other doctors caring for patient include: Dr. Debara Pickett Cardiology,   Advanced directives: Yes    Depression screen:  See questionnaire below.  Depression screen [facility] 2/9 03/21/2020 03/20/2019 03/18/2018 03/11/2017 02/26/2016  Decreased Interest 0 0 0 0 0  Down, Depressed, Hopeless 0 0 0 0 0  PHQ - 2 Score 0 0 0  0 0    Fall Risk Screen: see questionnaire below. Fall Risk  03/21/2020 03/20/2019 03/18/2018 03/11/2017 02/26/2016  Falls in the past year? 1 0 0 No No  Number falls in past yr: 1 - - - -  Injury with Fall? 1 - - - -    ADL screen:  See questionnaire below Functional Status Survey: Is the patient deaf or have difficulty hearing?: Yes (kids tell her she does) Does the patient have difficulty seeing, even when wearing glasses/contacts?: No Does the patient have difficulty concentrating, remembering, or making decisions?: No Does the patient have difficulty walking or climbing stairs?: No Does the patient have difficulty dressing or bathing?: No Does the patient have difficulty doing errands alone such as visiting a doctor's office or shopping?: No   Review of Systems Constitutional: -, -unexpected weight change, -anorexia, -fatigue Allergy: -sneezing, -itching, -congestion Dermatology: denies changing moles, rash, lumps ENT: -runny nose, -ear pain, -sore throat,  Cardiology:  -chest pain, -palpitations, -orthopnea, Respiratory: -cough, -shortness of breath, -dyspnea on exertion, -wheezing,  Gastroenterology: -abdominal pain, -nausea, -vomiting, -diarrhea, -constipation, -dysphagia Hematology: -bleeding or bruising problems Musculoskeletal: -arthralgias, -myalgias, -joint swelling, -back pain, - Ophthalmology: -vision changes,  Urology: -dysuria, -difficulty urinating,  -urinary frequency, -urgency, incontinence Neurology: -, -numbness, , -memory loss, -falls, -dizziness    PHYSICAL EXAM:  BP 120/74   Pulse (!) 58   Temp 98.1 F (36.7 C)   Ht 5' 7.5" (1.715 m)   Wt 159 lb (72.1 kg)   LMP 03/23/1994 (Approximate)   BMI 24.54 kg/m   General Appearance: Alert, cooperative, no distress, appears stated age Head: Normocephalic, without obvious abnormality, atraumatic Eyes: PERRL, conjunctiva/corneas  clear, EOM's intact, Ears: Normal TM's and external ear canals Nose: Nares  normal, mucosa normal, no drainage or sinus tenderness Throat: Lips, mucosa, and tongue normal; teeth and gums normal Neck: Supple, no lymphadenopathy;  thyroid:  no enlargement/tenderness/nodules; no carotid bruit or JVD Lungs: Clear to auscultation bilaterally without wheezes, rales or ronchi; respirations unlabored Heart: Regular rate and rhythm, S1 and S2 normal 3/6 systolic murmur blowing at the apex  abdomen: Soft, non-tender, nondistended, normoactive bowel sounds,  no masses, no hepatosplenomegaly Extremities: No clubbing, cyanosis or edema Pulses: 2+ and symmetric all extremities Skin:  Skin color, texture, turgor normal, no rashes or lesions Lymph nodes: Cervical, supraclavicular, and axillary nodes normal Neurologic:  CNII-XII intact, normal strength, sensation and gait; reflexes 2+ and symmetric throughout Psych: Normal mood, affect, hygiene and grooming. Hearing evaluation is abnormal for high frequencies ASSESSMENT/PLAN: Routine general medical examination at a health care facility - Plan: CBC with Differential/Platelet, Comprehensive metabolic panel, Lipid panel, Tympanometry  Primary hypertension  Hypothyroidism, unspecified type - Plan: TSH  History of lung cancer  Mixed hyperlipidemia - Plan: Lipid panel  Osteopenia, unspecified location - Plan: CBC with Differential/Platelet, Comprehensive metabolic panel  Mitral valve insufficiency, unspecified etiology  Nonrheumatic aortic valve insufficiency  History of cataract extraction, unspecified laterality  Allergic rhinitis due to pollen, unspecified seasonality  Family history of heart disease in female family member before age 41 - Plan: CBC with Differential/Platelet, Comprehensive metabolic panel She will continue on her present medication regimen.  I will send her to audiology to further evaluate her hearing.  Continue to be followed by the various specialists.     Immunization recommendations discussed.   Colonoscopy recommendations reviewed   Medicare Attestation I have personally reviewed: The patient's medical and social history Their use of alcohol, tobacco or illicit drugs Their current medications and supplements The patient's functional ability including ADLs,fall risks, home safety risks, cognitive, and hearing and visual impairment Diet and physical activities Evidence for depression or mood disorders  The patient's weight, height, and BMI have been recorded in the chart.  I have made referrals, counseling, and provided education to the patient based on review of the above and I have provided the patient with a written personalized care plan for preventive services.     Jill Alexanders, MD   03/21/2020

## 2020-03-21 NOTE — Patient Instructions (Signed)
°  Tina Owen , Thank you for taking time to come for your Medicare Wellness Visit. I appreciate your ongoing commitment to your health goals. Please review the following plan we discussed and let me know if I can assist you in the future.   These are the goals we discussed: Goals   None     This is a list of the screening recommended for you and due dates:  Health Maintenance  Topic Date Due   Flu Shot  10/22/2019   Colon Cancer Screening  05/10/2023   Tetanus Vaccine  11/19/2029   DEXA scan (bone density measurement)  Completed   COVID-19 Vaccine  Completed    Hepatitis C: One time screening is recommended by Center for Disease Control  (CDC) for  adults born from [birth year] through 1966.   Completed   Pneumonia vaccines  Completed

## 2020-03-22 LAB — COMPREHENSIVE METABOLIC PANEL
ALT: 19 IU/L (ref 0–32)
AST: 22 IU/L (ref 0–40)
Albumin/Globulin Ratio: 1.6 (ref 1.2–2.2)
Albumin: 4.2 g/dL (ref 3.7–4.7)
Alkaline Phosphatase: 67 IU/L (ref 44–121)
BUN/Creatinine Ratio: 19 (ref 12–28)
BUN: 19 mg/dL (ref 8–27)
Bilirubin Total: 0.7 mg/dL (ref 0.0–1.2)
CO2: 24 mmol/L (ref 20–29)
Calcium: 10.4 mg/dL — ABNORMAL HIGH (ref 8.7–10.3)
Chloride: 102 mmol/L (ref 96–106)
Creatinine, Ser: 1.01 mg/dL — ABNORMAL HIGH (ref 0.57–1.00)
GFR calc Af Amer: 63 mL/min/{1.73_m2} (ref >59)
GFR calc non Af Amer: 55 mL/min/{1.73_m2} — ABNORMAL LOW (ref >59)
Globulin, Total: 2.6 g/dL (ref 1.5–4.5)
Glucose: 100 mg/dL — ABNORMAL HIGH (ref 65–99)
Potassium: 4.3 mmol/L (ref 3.5–5.2)
Sodium: 143 mmol/L (ref 134–144)
Total Protein: 6.8 g/dL (ref 6.0–8.5)

## 2020-03-22 LAB — CBC WITH DIFFERENTIAL/PLATELET
Basophils Absolute: 0 10*3/uL (ref 0.0–0.2)
Basos: 0 %
EOS (ABSOLUTE): 0.1 10*3/uL (ref 0.0–0.4)
Eos: 2 %
Hematocrit: 43.4 % (ref 34.0–46.6)
Hemoglobin: 14.7 g/dL (ref 11.1–15.9)
Immature Grans (Abs): 0 10*3/uL (ref 0.0–0.1)
Immature Granulocytes: 0 %
Lymphocytes Absolute: 1.6 10*3/uL (ref 0.7–3.1)
Lymphs: 27 %
MCH: 30.8 pg (ref 26.6–33.0)
MCHC: 33.9 g/dL (ref 31.5–35.7)
MCV: 91 fL (ref 79–97)
Monocytes Absolute: 0.7 10*3/uL (ref 0.1–0.9)
Monocytes: 12 %
Neutrophils Absolute: 3.5 10*3/uL (ref 1.4–7.0)
Neutrophils: 59 %
Platelets: 156 10*3/uL (ref 150–450)
RBC: 4.78 x10E6/uL (ref 3.77–5.28)
RDW: 12.2 % (ref 11.7–15.4)
WBC: 5.9 10*3/uL (ref 3.4–10.8)

## 2020-03-22 LAB — LIPID PANEL
Chol/HDL Ratio: 2.5 ratio (ref 0.0–4.4)
Cholesterol, Total: 160 mg/dL (ref 100–199)
HDL: 63 mg/dL (ref >39)
LDL Chol Calc (NIH): 81 mg/dL (ref 0–99)
Triglycerides: 84 mg/dL (ref 0–149)
VLDL Cholesterol Cal: 16 mg/dL (ref 5–40)

## 2020-03-22 LAB — TSH: TSH: 3.34 u[IU]/mL (ref 0.450–4.500)

## 2020-04-10 ENCOUNTER — Ambulatory Visit: Payer: Medicare PPO | Admitting: Audiologist

## 2020-04-11 NOTE — Progress Notes (Signed)
Cardiology Office Note:    Date:  04/17/2020   ID:  Tina Owen, DOB 1945/11/18, MRN [id number]  PCP:  Denita Lung, MD  Cardiologist:  Pixie Casino, MD   Referring MD: Denita Lung, MD   Chief Complaint  Patient presents with  . Follow-up  HTN, HLD  History of Present Illness:    Tina Owen is a 75 y.o. female with a hx of lung cancer treated with chemo and radiation, pericardial effusion 12 yr later requiring window 2010 without recurrence, HLD, and HTN. She last saw Dr. Debara Pickett 04/17/19 and was doing well at that time. Zetia was added to her regimen. Echo was reviewed with trivial AI and mild MR. Aortic valve was sclerotic - LDL should be less than 70. She has done well with addition of chlorthalidone with bystolic.   She presents today for annual follow up. She has lost a considerable amount of weight - 25 lb weight loss. She has started walking and has a treadmill at home. She has followed [employer] diet plan ($38/ 6 mo). BP is mildly elevated - but she has white coat syndrome since she had cancer. No chest pain or dyspnea.  Overall she is doing remarkably well. She is expecting a grandchild this summer and will be visiting her daughter in [place] in July and then visiting [place]. We discussed that if she continues to lose weight, we may need to scale back her bystolic. Her mother died of heart disease at 21 - and this is on her mind as she just turned 95.   Past Medical History:  Diagnosis Date  . Allergy    SEASONAL  . Bilateral hip pain   . Blood transfusion without reported diagnosis    as an infant. 14 week old, 15  . Cancer ([place])    LUNG, 1998 - surgery, chemo, radiation  . Cataract   . Colonic polyp 2004  . Dyslipidemia   . Dyspnea   . FHx: cardiovascular disease   . Fibroid   . Heart murmur   . Hyperlipidemia   . Hypertension   . Hypothyroid   . Osteopenia     Past Surgical History:  Procedure Laterality Date  . BREAST CYST EXCISION Left     age 61-14  . CATARACT EXTRACTION, BILATERAL  08/2016  . CESAREAN SECTION  [id number]   x2  . CHOLECYSTECTOMY  1998  . COLONOSCOPY    . LAPAROSCOPIC BILATERAL SALPINGO OOPHERECTOMY N/A 09/29/2016   Procedure: LAPAROSCOPIC BILATERAL SALPINGO OOPHORECTOMY WITH WASHINGS;  Surgeon: Megan Salon, MD;  Location: [facility];  Service: Gynecology;  Laterality: N/A;  . LUNG CANCER SURGERY Left 1998   L upper lobectomy    . PERICARDIAL WINDOW  2010   Dr Arlyce Dice  . TRANSTHORACIC ECHOCARDIOGRAM  10/2010   EF>55%; LA mild-mod dilated; borderline RA englargement; mild MR, trace TR, mild AV regurg, trace PV regurg   . TUBAL LIGATION  1990    Current Medications: Current Meds  Medication Sig  . aspirin EC 81 MG tablet Take 81 mg by mouth daily.  Marland Kitchen atorvastatin (LIPITOR) 80 MG tablet TAKE 1 TABLET(80 MG) BY MOUTH DAILY  . BYSTOLIC 10 MG tablet TAKE 1 TABLET(10 MG) BY MOUTH DAILY  . calcium carbonate (OS-CAL) 600 MG TABS tablet Take by mouth.  . Calcium Carbonate-Vitamin D (CALCIUM PLUS VITAMIN D PO) Take 1 tablet by mouth at bedtime.  . chlorthalidone (HYGROTON) 25 MG tablet TAKE 1/2 TABLET(12.5 MG) BY MOUTH  DAILY  . Cholecalciferol (VITAMIN D3) 2000 units TABS Take 2,000 scoop by mouth daily.  Marland Kitchen ezetimibe (ZETIA) 10 MG tablet Take 1 tablet (10 mg total) by mouth daily.  Marland Kitchen ibuprofen (ADVIL) 200 MG tablet Take 200 mg by mouth every 6 (six) hours as needed.  Marland Kitchen levothyroxine (SYNTHROID) 100 MCG tablet TAKE 1 TABLET(100 MCG) BY MOUTH DAILY  . Multiple Vitamin (MULTIVITAMIN WITH MINERALS) TABS tablet Take 1 tablet by mouth at bedtime.     Allergies:   Tape   Social History   Socioeconomic History  . Marital status: Married    Spouse name: Not on file  . Number of children: 2  . Years of education: Not on file  . Highest education level: Not on file  Occupational History  . Occupation: retired     Fish farm manager: [employer]  Tobacco Use  . Smoking status: Former Smoker    Quit date: 03/24/1995     Years since quitting: 25.0  . Smokeless tobacco: Never Used  Substance and Sexual Activity  . Alcohol use: Yes    Alcohol/week: 7.0 standard drinks    Types: 7 Glasses of wine per week  . Drug use: No  . Sexual activity: Yes    Birth control/protection: Post-menopausal  Other Topics Concern  . Not on file  Social History Narrative  . Not on file   Social Determinants of Health   Financial Resource Strain: Not on file  Food Insecurity: Not on file  Transportation Needs: Not on file  Physical Activity: Not on file  Stress: Not on file  Social Connections: Not on file     Family History: The patient's family history includes Colon cancer in her brother; Heart disease in her father, maternal grandfather, maternal grandmother, and mother; Hypertension in her father and mother. There is no history of Esophageal cancer, Stomach cancer, or Rectal cancer.  ROS:   Please see the history of present illness.     All other systems reviewed and are negative.  EKGs/Labs/Other Studies Reviewed:    The following studies were reviewed today:  Echo 2020: 1. Left ventricular ejection fraction, by visual estimation, is 60 to  65%. The left ventricle has normal function. There is no left ventricular  hypertrophy.  2. Left ventricular diastolic parameters are indeterminate.  3. The left ventricle has no regional wall motion abnormalities.  4. Global right ventricle has normal systolic function.The right  ventricular size is normal.  5. Left atrial size was mildly dilated.  6. Right atrial size was normal.  7. Mild mitral annular calcification.  8. The mitral valve is normal in structure. Mild mitral valve  regurgitation. No evidence of mitral stenosis.  9. The tricuspid valve is normal in structure.  10. The aortic valve is tricuspid. Aortic valve regurgitation is mild.  Mild to moderate aortic valve sclerosis/calcification without any evidence  of aortic stenosis.  11. The  pulmonic valve was normal in structure. Pulmonic valve  regurgitation is not visualized.  12. The inferior vena cava is normal in size with greater than 50%  respiratory variability, suggesting right atrial pressure of 3 mmHg.  13. Normal LV function; mild AI and MR; mild LAE; GLS-18.8%.  EKG:  EKG is  ordered today.  The ekg ordered today demonstrates sinus rhythm HR 69 ST abnormalities stable from prior tracings  Recent Labs: 03/21/2020: ALT 19; BUN 19; Creatinine, Ser 1.01; Hemoglobin 14.7; Platelets 156; Potassium 4.3; Sodium 143; TSH 3.340  Recent Lipid Panel  Component Value Date/Time   CHOL 160 03/21/2020 1047   TRIG 84 03/21/2020 1047   HDL 63 03/21/2020 1047   CHOLHDL 2.5 03/21/2020 1047   CHOLHDL 3.7 03/11/2017 0947   VLDL 31 (H) 02/26/2016 0956   LDLCALC 81 03/21/2020 1047   LDLCALC 128 (H) 03/11/2017 0947    Physical Exam:    VS:  BP (!) 146/72   Pulse 69   Ht 5' 7.5" (1.715 m)   Wt 158 lb 6.4 oz (71.8 kg)   LMP 03/23/1994 (Approximate)   SpO2 97%   BMI 24.44 kg/m     Wt Readings from Last 3 Encounters:  04/17/20 158 lb 6.4 oz (71.8 kg)  03/21/20 159 lb (72.1 kg)  07/26/19 166 lb 3.2 oz (75.4 kg)     GEN:  Well nourished, well developed in no acute distress HEENT: Normal NECK: No JVD; No carotid bruits LYMPHATICS: No lymphadenopathy CARDIAC: RRR, 4/6 systolic murmur RESPIRATORY:  Clear to auscultation without rales, wheezing or rhonchi  ABDOMEN: Soft, non-tender, non-distended MUSCULOSKELETAL:  No edema; No deformity  SKIN: Warm and dry NEUROLOGIC:  Alert and oriented x 3 PSYCHIATRIC:  Normal affect   ASSESSMENT:    1. Mixed hyperlipidemia   2. Essential hypertension   3. Mitral valve insufficiency, unspecified etiology   4. Swelling of lower extremity    PLAN:    In order of problems listed above:  Hypertension - chlorthalidone and bystolic - well controlled - does have white coat hypertension   Hyperlipidemia with LDL goal <  70 03/21/2020: Cholesterol, Total 160; HDL 63; LDL Chol Calc (NIH) 81; Triglycerides 84 - Maintained on zetia and lipitor - no changes   Lower extremity swelling - chlorthalidone - no swelling   Remote pericardia effusion S/P window - stable   AI MR - loud murmur on exam - no dypsnea - repeat an echo next year    Follow up in 1 year.   Medication Adjustments/Labs and Tests Ordered: Current medicines are reviewed at length with the patient today.  Concerns regarding medicines are outlined above.  Orders Placed This Encounter  Procedures  . EKG 12-Lead   No orders of the defined types were placed in this encounter.   Signed, Ledora Bottcher, PA  04/17/2020 3:30 PM    [facility]

## 2020-04-17 ENCOUNTER — Ambulatory Visit: Payer: Medicare PPO | Attending: Family Medicine | Admitting: Audiologist

## 2020-04-17 ENCOUNTER — Other Ambulatory Visit: Payer: Self-pay

## 2020-04-17 ENCOUNTER — Encounter: Payer: Self-pay | Admitting: Physician Assistant

## 2020-04-17 ENCOUNTER — Ambulatory Visit: Payer: Medicare PPO | Admitting: Physician Assistant

## 2020-04-17 VITALS — BP 146/72 | HR 69 | Ht 67.5 in | Wt 158.4 lb

## 2020-04-17 DIAGNOSIS — I340 Nonrheumatic mitral (valve) insufficiency: Secondary | ICD-10-CM | POA: Diagnosis not present

## 2020-04-17 DIAGNOSIS — H903 Sensorineural hearing loss, bilateral: Secondary | ICD-10-CM | POA: Diagnosis not present

## 2020-04-17 DIAGNOSIS — E782 Mixed hyperlipidemia: Secondary | ICD-10-CM | POA: Diagnosis not present

## 2020-04-17 DIAGNOSIS — M7989 Other specified soft tissue disorders: Secondary | ICD-10-CM | POA: Diagnosis not present

## 2020-04-17 DIAGNOSIS — I10 Essential (primary) hypertension: Secondary | ICD-10-CM

## 2020-04-17 NOTE — Procedures (Signed)
  Outpatient Audiology and [place], [place]  [id number] [phone]  AUDIOLOGICAL  EVALUATION  NAME: Tina Owen     DOB:   [birth year]      MRN: [id number]                                                                                     DATE: 04/17/2020     REFERENT: Denita Lung, MD STATUS: Outpatient DIAGNOSIS:  Sensorineural hearing loss (SNHL) of both ears   History: Tina Owen was seen for an audiological evaluation. Tina Owen is receiving a hearing evaluation due to concerns for increased difficulty hearing in noise. Tina Owen has difficulty hearing in background noise and crowded places. She recently had a hard time hearing the [place]. This difficulty began gradually. No pain or pressure reported in either ear. Tinnitus present occasionally in both ears. 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 She also says her dogs often bark, more often in her left side.  Medical history negative for any risk factors for hearing loss. No other relevant case history reported.   Evaluation:   Otoscopy showed a clear view of the tympanic membranes, bilaterally  Tympanometry results were consistent with normal middle ear function, bilaterally    Audiometric testing was completed using conventional audiometry with insert transducer. Speech Recognition Thresholds were consistent with pure tone averages. Word Recognition was excellent at conversation and an elevated level. Pure tone thresholds show normal sloping after 3k Hz to a moderately severe sensorineural  hearing loss in both ears. Test results are consistent with asymmetry of 20dB at 6k Hz. Hearing symmetric by 8k Hz.   Results:  The test results were reviewed with Tina Owen. She was counseled on the nature and degree of her hearing loss. She was provided with several copies of her audiogram that illustrate the degree of hearing loss in both ears. her hearing loss is in  the highest frequencies only, preventing Tina Owen from hearing high frequency consonants such as /s/ /sh/ /f/ /t/ and /th/. These sounds help differentiate the words he  hears. Without these sounds, speech is muffled and unclear unless someone is face to face within 5 feet without a mask. Tina Owen is not yet a hearing aid candidate as her hearing is normal past 3k Hz in both ears. Annual audiology evaluations and good communication strategies recommended.   Recommendations: 1.   Annual audiologic testing is needed to monitor progression of slight asymmetric sensorineural hearing loss.    Alfonse Alpers  Audiologist, Au.D., CCC-A 04/17/2020  12:20 PM  Cc: Denita Lung, MD

## 2020-04-17 NOTE — Patient Instructions (Signed)
Medication Instructions:  No Changes *If you need a refill on your cardiac medications before your next appointment, please call your pharmacy*   Lab Work: No Labs If you have labs (blood work) drawn today and your tests are completely normal, you will receive your results only by: Marland Kitchen MyChart Message (if you have MyChart) OR . A paper copy in the mail If you have any lab test that is abnormal or we need to change your treatment, we will call you to review the results.   Testing/Procedures: No Testing   Follow-Up: At [facility], you and your health needs are our priority.  As part of our continuing mission to provide you with exceptional heart care, we have created designated Provider Care Teams.  These Care Teams include your primary Cardiologist (physician) and Advanced Practice Providers (APPs -  Physician Assistants and Nurse Practitioners) who all work together to provide you with the care you need, when you need it.   Your next appointment:   1 year(s)  The format for your next appointment:   In Person  Provider:   K. Mali Hilty, MD

## 2020-05-17 DIAGNOSIS — Z1152 Encounter for screening for COVID-19: Secondary | ICD-10-CM | POA: Diagnosis not present

## 2020-06-22 DIAGNOSIS — Z1152 Encounter for screening for COVID-19: Secondary | ICD-10-CM | POA: Diagnosis not present

## 2020-06-22 DIAGNOSIS — Z20822 Contact with and (suspected) exposure to covid-19: Secondary | ICD-10-CM | POA: Diagnosis not present

## 2020-09-03 ENCOUNTER — Other Ambulatory Visit: Payer: Self-pay | Admitting: Family Medicine

## 2020-09-03 DIAGNOSIS — I10 Essential (primary) hypertension: Secondary | ICD-10-CM

## 2020-12-02 DIAGNOSIS — Z8249 Family history of ischemic heart disease and other diseases of the circulatory system: Secondary | ICD-10-CM | POA: Diagnosis not present

## 2020-12-02 DIAGNOSIS — E039 Hypothyroidism, unspecified: Secondary | ICD-10-CM | POA: Diagnosis not present

## 2020-12-02 DIAGNOSIS — Z87891 Personal history of nicotine dependence: Secondary | ICD-10-CM | POA: Diagnosis not present

## 2020-12-02 DIAGNOSIS — I10 Essential (primary) hypertension: Secondary | ICD-10-CM | POA: Diagnosis not present

## 2020-12-02 DIAGNOSIS — Z85118 Personal history of other malignant neoplasm of bronchus and lung: Secondary | ICD-10-CM | POA: Diagnosis not present

## 2020-12-02 DIAGNOSIS — Z811 Family history of alcohol abuse and dependence: Secondary | ICD-10-CM | POA: Diagnosis not present

## 2020-12-02 DIAGNOSIS — E785 Hyperlipidemia, unspecified: Secondary | ICD-10-CM | POA: Diagnosis not present

## 2020-12-02 DIAGNOSIS — J301 Allergic rhinitis due to pollen: Secondary | ICD-10-CM | POA: Diagnosis not present

## 2020-12-02 DIAGNOSIS — Z7982 Long term (current) use of aspirin: Secondary | ICD-10-CM | POA: Diagnosis not present

## 2021-03-08 ENCOUNTER — Other Ambulatory Visit: Payer: Self-pay | Admitting: Family Medicine

## 2021-03-08 DIAGNOSIS — I10 Essential (primary) hypertension: Secondary | ICD-10-CM

## 2021-03-08 DIAGNOSIS — E782 Mixed hyperlipidemia: Secondary | ICD-10-CM

## 2021-03-12 DIAGNOSIS — Z1231 Encounter for screening mammogram for malignant neoplasm of breast: Secondary | ICD-10-CM | POA: Diagnosis not present

## 2021-03-12 LAB — HM MAMMOGRAPHY

## 2021-03-17 ENCOUNTER — Other Ambulatory Visit: Payer: Self-pay | Admitting: Family Medicine

## 2021-03-17 DIAGNOSIS — E039 Hypothyroidism, unspecified: Secondary | ICD-10-CM

## 2021-03-18 ENCOUNTER — Other Ambulatory Visit: Payer: Self-pay

## 2021-03-18 DIAGNOSIS — E785 Hyperlipidemia, unspecified: Secondary | ICD-10-CM

## 2021-03-18 MED ORDER — ATORVASTATIN CALCIUM 80 MG PO TABS: ORAL_TABLET | ORAL | 1 refills | Status: DC

## 2021-03-26 ENCOUNTER — Other Ambulatory Visit: Payer: Self-pay

## 2021-03-31 ENCOUNTER — Ambulatory Visit: Payer: Medicare PPO | Admitting: Family Medicine

## 2021-03-31 ENCOUNTER — Other Ambulatory Visit: Payer: Self-pay

## 2021-03-31 ENCOUNTER — Encounter: Payer: Self-pay | Admitting: Family Medicine

## 2021-03-31 VITALS — BP 140/86 | HR 64 | Temp 97.3°F | Ht 67.0 in | Wt 160.4 lb

## 2021-03-31 DIAGNOSIS — M8580 Other specified disorders of bone density and structure, unspecified site: Secondary | ICD-10-CM

## 2021-03-31 DIAGNOSIS — Z9849 Cataract extraction status, unspecified eye: Secondary | ICD-10-CM

## 2021-03-31 DIAGNOSIS — Z8249 Family history of ischemic heart disease and other diseases of the circulatory system: Secondary | ICD-10-CM | POA: Diagnosis not present

## 2021-03-31 DIAGNOSIS — I10 Essential (primary) hypertension: Secondary | ICD-10-CM

## 2021-03-31 DIAGNOSIS — Z0000 Encounter for general adult medical examination without abnormal findings: Secondary | ICD-10-CM | POA: Diagnosis not present

## 2021-03-31 DIAGNOSIS — E039 Hypothyroidism, unspecified: Secondary | ICD-10-CM | POA: Diagnosis not present

## 2021-03-31 DIAGNOSIS — I340 Nonrheumatic mitral (valve) insufficiency: Secondary | ICD-10-CM | POA: Diagnosis not present

## 2021-03-31 DIAGNOSIS — Z85118 Personal history of other malignant neoplasm of bronchus and lung: Secondary | ICD-10-CM

## 2021-03-31 DIAGNOSIS — E785 Hyperlipidemia, unspecified: Secondary | ICD-10-CM | POA: Diagnosis not present

## 2021-03-31 DIAGNOSIS — I351 Nonrheumatic aortic (valve) insufficiency: Secondary | ICD-10-CM | POA: Diagnosis not present

## 2021-03-31 DIAGNOSIS — Z86010 Personal history of colonic polyps: Secondary | ICD-10-CM

## 2021-03-31 DIAGNOSIS — J301 Allergic rhinitis due to pollen: Secondary | ICD-10-CM

## 2021-03-31 DIAGNOSIS — E782 Mixed hyperlipidemia: Secondary | ICD-10-CM

## 2021-03-31 MED ORDER — CHLORTHALIDONE 25 MG PO TABS: ORAL_TABLET | ORAL | 3 refills | Status: DC

## 2021-03-31 MED ORDER — LEVOTHYROXINE SODIUM 100 MCG PO TABS: 100.0000 ug | ORAL_TABLET | Freq: Every day | ORAL | 3 refills | Status: DC

## 2021-03-31 MED ORDER — NEBIVOLOL HCL 10 MG PO TABS: ORAL_TABLET | ORAL | 3 refills | Status: DC

## 2021-03-31 MED ORDER — ATORVASTATIN CALCIUM 80 MG PO TABS: ORAL_TABLET | ORAL | 3 refills | Status: DC

## 2021-03-31 MED ORDER — EZETIMIBE 10 MG PO TABS: ORAL_TABLET | ORAL | 3 refills | Status: DC

## 2021-03-31 NOTE — Progress Notes (Signed)
Tina Owen is a 76 y.o. female who presents for annual wellness visit and follow-up on chronic medical conditions.  She has no particular concerns or complaints.  She continues on chlorthalidone and Bystolic for her blood pressure.  Is also taking atorvastatin without trouble.  She continues on Zetia as well as Lipitor and having no difficulty with that.  She is on Synthroid and having no difficulty with that.  Does have a history of osteopenia and has been using calcium and vitamin D.  Her allergies are under good control.  She is followed by cardiology for her mitral valve and aortic valve issues.  She has had cataracts removed.  Does have a history of colonic polyps and is scheduled for repeat colonoscopy in 2025.  She does have a remote history of lung cancer.  She is now retired and she and her husband are enjoying their retirement.  Immunizations and Health Maintenance Immunization History  Administered Date(s) Administered   DTaP 01/01/1986   Influenza Split 02/05/2011, 01/19/2013   Influenza-Unspecified 12/18/2013, 12/21/2014, 12/24/2015, 01/25/2017, 01/04/2018, 12/03/2020   PFIZER Comirnaty(Gray Top)Covid-19 Tri-Sucrose Vaccine 04/23/2020, 07/27/2020   PFIZER(Purple Top)SARS-COV-2 Vaccination 04/17/2019, 05/08/2019, 01/09/2020   Pfizer Covid-19 Vaccine Bivalent Booster 47yrs & up 12/03/2020   Pneumococcal Conjugate-13 03/11/2017   Pneumococcal Polysaccharide-23 12/11/2005, 02/08/2012   Tdap 02/03/2008, 02/05/2011, 11/20/2019   Zoster Recombinat (Shingrix) 07/13/2016, 10/25/2016   Zoster, Live 05/11/2006   There are no preventive care reminders to display for this patient.   Last Pap smear: aged out  Last mammogram: 03/12/21 Last colonoscopy: 05/09/2018 Dr. Henrene Pastor   Last DEXA: 03/24/2018 Dentist: Q three months Ophtho: Q year Exercise: walking five days a week   Other doctors caring for patient include: Dr. Henrene Pastor GI                       Dr. Gershon Crane Ophthalmology                Dr. Debara Pickett Cardiology  Advanced directives: Does Patient Have a Medical Advance Directive?: Yes Type of Advance Directive: Living will, Healthcare Power of [place], Out of facility DNR (pink MOST or yellow form) Does patient want to make changes to medical advance directive?: No - Patient declined Copy of [place] in Chart?: Yes - validated most recent copy scanned in chart (See row information) Pre-existing out of facility DNR order (yellow form or pink MOST form): Pink MOST form placed in chart (order not valid for inpatient use)  Depression screen:  See questionnaire below.  Depression screen [facility] 2/9 03/31/2021 03/21/2020 03/20/2019 03/18/2018 03/11/2017  Decreased Interest 0 0 0 0 0  Down, Depressed, Hopeless 0 0 0 0 0  PHQ - 2 Score 0 0 0 0 0    Fall Risk Screen: see questionnaire below. Fall Risk  03/31/2021 03/21/2020 03/20/2019 03/18/2018 03/11/2017  Falls in the past year? 0 1 0 0 No  Number falls in past yr: 0 1 - - -  Injury with Fall? 0 1 - - -  Risk for fall due to : No Fall Risks - - - -  Follow up Falls evaluation completed - - - -    ADL screen:  See questionnaire below Functional Status Survey: Is the patient deaf or have difficulty hearing?: No Does the patient have difficulty seeing, even when wearing glasses/contacts?: No Does the patient have difficulty concentrating, remembering, or making decisions?: No Does the patient have difficulty walking or climbing stairs?: Yes Does  the patient have difficulty dressing or bathing?: No Does the patient have difficulty doing errands alone such as visiting a doctor's office or shopping?: No   Review of Systems Constitutional: -, -unexpected weight change, -anorexia, -fatigue Allergy: -sneezing, -itching, -congestion Dermatology: denies changing moles, rash, lumps ENT: -runny nose, -ear pain, -sore throat,  Cardiology:  -chest pain, -palpitations, -orthopnea, Respiratory: -cough, -shortness of breath,  -dyspnea on exertion, -wheezing,  Gastroenterology: -abdominal pain, -nausea, -vomiting, -diarrhea, -constipation, -dysphagia Hematology: -bleeding or bruising problems Musculoskeletal: -arthralgias, -myalgias, -joint swelling, -back pain, - Ophthalmology: -vision changes,  Urology: -dysuria, -difficulty urinating,  -urinary frequency, -urgency, incontinence Neurology: -, -numbness, , -memory loss, -falls, -dizziness    PHYSICAL EXAM:  LMP 03/23/1994 (Approximate)   General Appearance: Alert, cooperative, no distress, appears stated age Head: Normocephalic, without obvious abnormality, atraumatic Eyes: PERRL, conjunctiva/corneas clear, EOM's intact,  Ears: Normal TM's and external ear canals Nose: Nares normal, mucosa normal, no drainage or sinus tenderness Throat: Lips, mucosa, and tongue normal; teeth and gums normal Neck: Supple, no lymphadenopathy;  thyroid:  no enlargement/tenderness/nodules; no carotid bruit or JVD Lungs: Clear to auscultation bilaterally without wheezes, rales or ronchi; respirations unlabored Heart: Regular rate and rhythm, S1 and S2 normal, no murmur, rubor gallop Abdomen: Soft, non-tender, nondistended, normoactive bowel sounds,  no masses, no hepatosplenomegaly Skin:  Skin color, texture, turgor normal, no rashes or lesions Lymph nodes: Cervical, supraclavicular, and axillary nodes normal Neurologic:  CNII-XII intact, normal strength, sensation and gait; reflexes 2+ and symmetric throughout Psych: Normal mood, affect, hygiene and grooming.  ASSESSMENT/PLAN: Routine general medical examination at a health care facility - Plan: CBC with Differential/Platelet, Comprehensive metabolic panel, Lipid panel  Primary hypertension  Hypothyroidism, unspecified type - Plan: TSH, levothyroxine (SYNTHROID) 100 MCG tablet  History of lung cancer  Mixed hyperlipidemia - Plan: ezetimibe (ZETIA) 10 MG tablet  Osteopenia, unspecified location - Plan: DG Bone  Density  Mitral valve insufficiency, unspecified etiology  Nonrheumatic aortic valve insufficiency  History of cataract extraction, unspecified laterality  Allergic rhinitis due to pollen, unspecified seasonality  Family history of heart disease in female family member before age 38 - Plan: CBC with Differential/Platelet, Comprehensive metabolic panel, Lipid panel  Essential hypertension - Plan: CBC with Differential/Platelet, Comprehensive metabolic panel, nebivolol (BYSTOLIC) 10 MG tablet, chlorthalidone (HYGROTON) 25 MG tablet  Hyperlipidemia with target LDL less than 100 - Plan: Lipid panel, atorvastatin (LIPITOR) 80 MG tablet  History of colonic polyps She will follow-up with gastroenterology as already planned.  Continue on present medications.   healthy diet, including goals of calcium and vitamin D intake .  Immunization recommendations discussed.  Colonoscopy recommendations reviewed   Medicare Attestation I have personally reviewed: The patient's medical and social history Their use of alcohol, tobacco or illicit drugs Their current medications and supplements The patient's functional ability including ADLs,fall risks, home safety risks, cognitive, and hearing and visual impairment Diet and physical activities Evidence for depression or mood disorders  The patient's weight, height, and BMI have been recorded in the chart.  I have made referrals, counseling, and provided education to the patient based on review of the above and I have provided the patient with a written personalized care plan for preventive services.     Jill Alexanders, MD   03/31/2021

## 2021-04-01 LAB — CBC WITH DIFFERENTIAL/PLATELET
Basophils Absolute: 0 10*3/uL (ref 0.0–0.2)
Basos: 1 %
EOS (ABSOLUTE): 0.1 10*3/uL (ref 0.0–0.4)
Eos: 1 %
Hematocrit: 45.4 % (ref 34.0–46.6)
Hemoglobin: 15.6 g/dL (ref 11.1–15.9)
Immature Grans (Abs): 0 10*3/uL (ref 0.0–0.1)
Immature Granulocytes: 0 %
Lymphocytes Absolute: 1.8 10*3/uL (ref 0.7–3.1)
Lymphs: 25 %
MCH: 30.9 pg (ref 26.6–33.0)
MCHC: 34.4 g/dL (ref 31.5–35.7)
MCV: 90 fL (ref 79–97)
Monocytes Absolute: 0.7 10*3/uL (ref 0.1–0.9)
Monocytes: 10 %
Neutrophils Absolute: 4.5 10*3/uL (ref 1.4–7.0)
Neutrophils: 63 %
Platelets: 178 10*3/uL (ref 150–450)
RBC: 5.05 x10E6/uL (ref 3.77–5.28)
RDW: 12 % (ref 11.7–15.4)
WBC: 7.2 10*3/uL (ref 3.4–10.8)

## 2021-04-01 LAB — COMPREHENSIVE METABOLIC PANEL
ALT: 29 IU/L (ref 0–32)
AST: 25 IU/L (ref 0–40)
Albumin/Globulin Ratio: 2 (ref 1.2–2.2)
Albumin: 4.7 g/dL (ref 3.7–4.7)
Alkaline Phosphatase: 71 IU/L (ref 44–121)
BUN/Creatinine Ratio: 14 (ref 12–28)
BUN: 15 mg/dL (ref 8–27)
Bilirubin Total: 0.9 mg/dL (ref 0.0–1.2)
CO2: 26 mmol/L (ref 20–29)
Calcium: 10.4 mg/dL — ABNORMAL HIGH (ref 8.7–10.3)
Chloride: 101 mmol/L (ref 96–106)
Creatinine, Ser: 1.07 mg/dL — ABNORMAL HIGH (ref 0.57–1.00)
Globulin, Total: 2.3 g/dL (ref 1.5–4.5)
Glucose: 99 mg/dL (ref 70–99)
Potassium: 3.7 mmol/L (ref 3.5–5.2)
Sodium: 141 mmol/L (ref 134–144)
Total Protein: 7 g/dL (ref 6.0–8.5)
eGFR: 54 mL/min/{1.73_m2} — ABNORMAL LOW (ref >59)

## 2021-04-01 LAB — LIPID PANEL
Chol/HDL Ratio: 2.4 ratio (ref 0.0–4.4)
Cholesterol, Total: 178 mg/dL (ref 100–199)
HDL: 74 mg/dL (ref >39)
LDL Chol Calc (NIH): 86 mg/dL (ref 0–99)
Triglycerides: 98 mg/dL (ref 0–149)
VLDL Cholesterol Cal: 18 mg/dL (ref 5–40)

## 2021-04-01 LAB — TSH: TSH: 2.66 u[IU]/mL (ref 0.450–4.500)

## 2021-04-02 ENCOUNTER — Ambulatory Visit: Payer: Medicare PPO | Admitting: Family Medicine

## 2021-04-03 ENCOUNTER — Other Ambulatory Visit: Payer: Self-pay

## 2021-04-03 ENCOUNTER — Ambulatory Visit (INDEPENDENT_AMBULATORY_CARE_PROVIDER_SITE_OTHER): Payer: Medicare PPO

## 2021-04-03 ENCOUNTER — Ambulatory Visit (HOSPITAL_COMMUNITY)
Admission: EM | Admit: 2021-04-03 | Discharge: 2021-04-03 | Disposition: A | Discharge status: Home / Self Care | Payer: Medicare PPO | Attending: Internal Medicine | Admitting: Internal Medicine

## 2021-04-03 ENCOUNTER — Encounter (HOSPITAL_COMMUNITY): Payer: Self-pay | Admitting: Emergency Medicine

## 2021-04-03 DIAGNOSIS — M79642 Pain in left hand: Secondary | ICD-10-CM | POA: Diagnosis not present

## 2021-04-03 DIAGNOSIS — S60222A Contusion of left hand, initial encounter: Secondary | ICD-10-CM | POA: Diagnosis not present

## 2021-04-03 DIAGNOSIS — W19XXXA Unspecified fall, initial encounter: Secondary | ICD-10-CM

## 2021-04-03 DIAGNOSIS — M7989 Other specified soft tissue disorders: Secondary | ICD-10-CM | POA: Diagnosis not present

## 2021-04-03 NOTE — ED Triage Notes (Signed)
PT fell yesterday and caught herself with left hand outstretched. Left hand bruised and swollen.

## 2021-04-03 NOTE — Discharge Instructions (Signed)
Your x-rays negative for fracture Tylenol or Motrin as needed for pain Gentle range of motion exercises Wrist brace as needed for pain Return to urgent care if symptoms worsen.

## 2021-04-03 NOTE — ED Provider Notes (Signed)
[facility]    CSN: [id number] Arrival date & time: 04/03/21  1332      History   Chief Complaint Chief Complaint  Patient presents with   Hand Injury    HPI Tina Owen is a 76 y.o. female comes to the urgent care with 4-day history of left wrist pain, bruising and swelling.  Patient lost her balance and fell on Monday.  She fell on an outstretched hand.  She denies hitting her head or losing consciousness.  She has been icing her wrist over the past few days.  She noticed bruising in the left hand and hence the visit to the urgent care.  She is concerned that she may have broken her wrist.  Patient denies any significant pain.  No numbness or tingling in the fingers.   HPI  Past Medical History:  Diagnosis Date   Allergy    SEASONAL   Bilateral hip pain    Blood transfusion without reported diagnosis    as an infant. 63 week old, 1947   Cancer [facility])    LUNG, 1998 - surgery, chemo, radiation   Cataract    Colonic polyp 2004   Dyslipidemia    Dyspnea    FHx: cardiovascular disease    Fibroid    Heart murmur    Hyperlipidemia    Hypertension    Hypothyroid    Osteopenia     Patient Active Problem List   Diagnosis Date Noted   History of colonic polyps 03/31/2021   History of cataract extraction 03/11/2017   History of bilateral salpingo-oophorectomy 03/11/2017   Mitral valve insufficiency 02/19/2016   Nonrheumatic aortic valve insufficiency 02/19/2016   Essential hypertension 02/05/2011   Hypothyroid 02/05/2011   History of lung cancer 02/05/2011   Hyperlipidemia with target LDL less than 100 02/05/2011   Osteopenia 02/05/2011   Family history of heart disease in female family member before age 66 02/05/2011   Allergic rhinitis 02/05/2011    Past Surgical History:  Procedure Laterality Date   BREAST CYST EXCISION Left    age 56-14   CATARACT EXTRACTION, BILATERAL  08/2016   CESAREAN SECTION  [id number]   x2   CHOLECYSTECTOMY  1998    COLONOSCOPY     LAPAROSCOPIC BILATERAL SALPINGO OOPHERECTOMY N/A 09/29/2016   Procedure: LAPAROSCOPIC BILATERAL SALPINGO OOPHORECTOMY WITH WASHINGS;  Surgeon: Megan Salon, MD;  Location: [place] [facility];  Service: Gynecology;  Laterality: N/A;   LUNG CANCER SURGERY Left 1998   L upper lobectomy     PERICARDIAL WINDOW  2010   Dr Arlyce Dice   TRANSTHORACIC ECHOCARDIOGRAM  10/2010   EF>55%; LA mild-mod dilated; borderline RA englargement; mild MR, trace TR, mild AV regurg, trace PV regurg    TUBAL LIGATION  1990    OB History     Gravida  2   Para  2   Term      Preterm      AB      Living  2      SAB      IAB      Ectopic      Multiple      Live Births  2            Home Medications    Prior to Admission medications   Medication Sig Start Date End Date Taking? Authorizing Provider  aspirin EC 81 MG tablet Take 81 mg by mouth daily.    [provider]  atorvastatin (LIPITOR) 80  MG tablet Take one tablet qd 03/31/21   Denita Lung, MD  Calcium Carbonate-Vitamin D (CALCIUM PLUS VITAMIN D PO) Take 1 tablet by mouth at bedtime.    [provider]  chlorthalidone (HYGROTON) 25 MG tablet TAKE 1/2 TABLET(12.5 MG) BY MOUTH DAILY 03/31/21   Denita Lung, MD  Cholecalciferol (VITAMIN D3) 2000 units TABS Take 2,000 scoop by mouth daily.    [provider]  ezetimibe (ZETIA) 10 MG tablet TAKE 1 TABLET(10 MG) BY MOUTH DAILY 03/31/21   Denita Lung, MD  ibuprofen (ADVIL) 200 MG tablet Take 200 mg by mouth every 6 (six) hours as needed.    [provider]  levothyroxine (SYNTHROID) 100 MCG tablet Take 1 tablet (100 mcg total) by mouth daily before breakfast. 03/31/21   Denita Lung, MD  Multiple Vitamin (MULTIVITAMIN WITH MINERALS) TABS tablet Take 1 tablet by mouth at bedtime.    [provider]  nebivolol (BYSTOLIC) 10 MG tablet TAKE 1 TABLET(10 MG) BY MOUTH DAILY 03/31/21   Denita Lung, MD    Family History Family History  Problem  Relation Age of Onset   Hypertension Mother    Heart disease Mother    Hypertension Father    Heart disease Father    Heart disease Maternal Grandmother    Heart disease Maternal Grandfather    Colon cancer Brother        this was a half brother   Esophageal cancer Neg Hx    Stomach cancer Neg Hx    Rectal cancer Neg Hx     Social History Social History   Tobacco Use   Smoking status: Former    Types: Cigarettes    Quit date: 03/24/1995    Years since quitting: 26.0   Smokeless tobacco: Never  Substance Use Topics   Alcohol use: Yes    Alcohol/week: 7.0 standard drinks    Types: 7 Glasses of wine per week   Drug use: No     Allergies   Tape   Review of Systems Review of Systems  HENT: Negative.    Gastrointestinal: Negative.   Musculoskeletal:  Positive for arthralgias, joint swelling and myalgias.  Skin:  Positive for color change. Negative for pallor.  Neurological: Negative.     Physical Exam Triage Vital Signs ED Triage Vitals  Enc Vitals Group     BP 04/03/21 1450 (!) 173/88     Pulse Rate 04/03/21 1450 74     Resp 04/03/21 1450 16     Temp 04/03/21 1450 97.8 F (36.6 C)     Temp Source 04/03/21 1450 Oral     SpO2 04/03/21 1450 99 %     Weight --      Height --      Head Circumference --      Peak Flow --      Pain Score 04/03/21 1449 3     Pain Loc --      Pain Edu? --      Excl. in [place]? --    No data found.  Updated Vital Signs BP (!) 173/88    Pulse 74    Temp 97.8 F (36.6 C) (Oral)    Resp 16    LMP 03/23/1994 (Approximate)    SpO2 99%   Visual Acuity Right Eye Distance:   Left Eye Distance:   Bilateral Distance:    Right Eye Near:   Left Eye Near:    Bilateral Near:  Physical Exam Vitals and nursing note reviewed.  Constitutional:      General: She is not in acute distress.    Appearance: She is not ill-appearing.  Cardiovascular:     Rate and Rhythm: Normal rate and regular rhythm.  Musculoskeletal:        General:  Swelling and signs of injury present. Normal range of motion.     Comments: Swelling in the left wrist.  Patient has bruising in the palm of the left hand as well as the dorsum of the left hand.  Full range of motion of the left wrist.  Patient is able to make a fist with no significant discomfort.  Neurological:     Mental Status: She is alert.     UC Treatments / Results  Labs (all labs ordered are listed, but only abnormal results are displayed) Labs Reviewed - No data to display  EKG   Radiology DG Hand Complete Left  Result Date: 04/03/2021 CLINICAL DATA:  Fall on outstretched hand EXAM: LEFT HAND - COMPLETE 3+ VIEW COMPARISON:  None. FINDINGS: There is no evidence of fracture or dislocation. Mild osteoarthritis of the hand and wrist, most pronounced at the first [facility] and triscaphe joints. Soft tissue swelling of the dorsal hand and wrist. IMPRESSION: No evidence of acute fracture or dislocation. Soft tissue swelling of the dorsal hand and wrist. Electronically Signed   By: Davina Poke D.O.   On: 04/03/2021 15:03    Procedures Procedures (including critical care time)  Medications Ordered in UC Medications - No data to display  Initial Impression / Assessment and Plan / UC Course  I have reviewed the triage vital signs and the nursing notes.  Pertinent labs & imaging results that were available during my care of the patient were reviewed by me and considered in my medical decision making (see chart for details).     1.  Left wrist sprain: Continue ibuprofen or Tylenol as needed for pain Gentle range of motion exercises Elevation of the left arm Left wrist splint as needed Return to urgent care if symptoms worsen. X-ray was negative for acute fracture. Final Clinical Impressions(s) / UC Diagnoses   Final diagnoses:  Contusion of left hand, initial encounter     Discharge Instructions      Your x-rays negative for fracture Tylenol or Motrin as needed for  pain Gentle range of motion exercises Wrist brace as needed for pain Return to urgent care if symptoms worsen.    ED Prescriptions   None    PDMP not reviewed this encounter.   Chase Picket, MD 04/03/21 [id number]

## 2021-06-09 ENCOUNTER — Other Ambulatory Visit: Payer: Self-pay | Admitting: Family Medicine

## 2021-06-09 DIAGNOSIS — E785 Hyperlipidemia, unspecified: Secondary | ICD-10-CM

## 2021-07-07 ENCOUNTER — Ambulatory Visit: Payer: Medicare PPO | Admitting: Internal Medicine

## 2021-08-12 ENCOUNTER — Encounter (HOSPITAL_COMMUNITY)
Admission: EM | Disposition: A | Discharge status: Home / Self Care | Payer: Self-pay | Source: Home / Self Care | Attending: Emergency Medicine

## 2021-08-12 ENCOUNTER — Ambulatory Visit (HOSPITAL_COMMUNITY)
Admission: EM | Admit: 2021-08-12 | Discharge: 2021-08-12 | Disposition: A | Discharge status: Home / Self Care | Payer: Medicare PPO | Attending: Orthopedic Surgery | Admitting: Orthopedic Surgery

## 2021-08-12 ENCOUNTER — Other Ambulatory Visit: Payer: Self-pay

## 2021-08-12 ENCOUNTER — Emergency Department (HOSPITAL_COMMUNITY): Payer: Medicare PPO | Admitting: Anesthesiology

## 2021-08-12 ENCOUNTER — Encounter (HOSPITAL_COMMUNITY): Payer: Self-pay | Admitting: Emergency Medicine

## 2021-08-12 ENCOUNTER — Emergency Department (HOSPITAL_COMMUNITY): Payer: Medicare PPO

## 2021-08-12 ENCOUNTER — Emergency Department (HOSPITAL_BASED_OUTPATIENT_CLINIC_OR_DEPARTMENT_OTHER): Payer: Medicare PPO | Admitting: Anesthesiology

## 2021-08-12 DIAGNOSIS — I10 Essential (primary) hypertension: Secondary | ICD-10-CM | POA: Insufficient documentation

## 2021-08-12 DIAGNOSIS — I340 Nonrheumatic mitral (valve) insufficiency: Secondary | ICD-10-CM | POA: Diagnosis not present

## 2021-08-12 DIAGNOSIS — Z87891 Personal history of nicotine dependence: Secondary | ICD-10-CM | POA: Insufficient documentation

## 2021-08-12 DIAGNOSIS — E039 Hypothyroidism, unspecified: Secondary | ICD-10-CM | POA: Insufficient documentation

## 2021-08-12 DIAGNOSIS — W540XXA Bitten by dog, initial encounter: Secondary | ICD-10-CM | POA: Insufficient documentation

## 2021-08-12 DIAGNOSIS — R457 State of emotional shock and stress, unspecified: Secondary | ICD-10-CM | POA: Diagnosis not present

## 2021-08-12 DIAGNOSIS — I959 Hypotension, unspecified: Secondary | ICD-10-CM | POA: Diagnosis not present

## 2021-08-12 DIAGNOSIS — S81812A Laceration without foreign body, left lower leg, initial encounter: Secondary | ICD-10-CM | POA: Insufficient documentation

## 2021-08-12 DIAGNOSIS — S81852A Open bite, left lower leg, initial encounter: Secondary | ICD-10-CM

## 2021-08-12 DIAGNOSIS — I351 Nonrheumatic aortic (valve) insufficiency: Secondary | ICD-10-CM | POA: Diagnosis not present

## 2021-08-12 HISTORY — PX: INCISION AND DRAINAGE OF WOUND: SHX1803

## 2021-08-12 LAB — CBC WITH DIFFERENTIAL/PLATELET
Abs Immature Granulocytes: 0.12 10*3/uL — ABNORMAL HIGH (ref 0.00–0.07)
Basophils Absolute: 0 10*3/uL (ref 0.0–0.1)
Basophils Relative: 0 %
Eosinophils Absolute: 0.1 10*3/uL (ref 0.0–0.5)
Eosinophils Relative: 1 %
HCT: 37.4 % (ref 36.0–46.0)
Hemoglobin: 13.3 g/dL (ref 12.0–15.0)
Immature Granulocytes: 1 %
Lymphocytes Relative: 12 %
Lymphs Abs: 1.6 10*3/uL (ref 0.7–4.0)
MCH: 31.8 pg (ref 26.0–34.0)
MCHC: 35.6 g/dL (ref 30.0–36.0)
MCV: 89.5 fL (ref 80.0–100.0)
Monocytes Absolute: 1.1 10*3/uL — ABNORMAL HIGH (ref 0.1–1.0)
Monocytes Relative: 8 %
Neutro Abs: 10.7 10*3/uL — ABNORMAL HIGH (ref 1.7–7.7)
Neutrophils Relative %: 78 %
Platelets: 152 10*3/uL (ref 150–400)
RBC: 4.18 MIL/uL (ref 3.87–5.11)
RDW: 12.4 % (ref 11.5–15.5)
WBC: 13.7 10*3/uL — ABNORMAL HIGH (ref 4.0–10.5)
nRBC: 0 % (ref 0.0–0.2)

## 2021-08-12 LAB — BASIC METABOLIC PANEL
Anion gap: 7 (ref 5–15)
BUN: 22 mg/dL (ref 8–23)
CO2: 23 mmol/L (ref 22–32)
Calcium: 9.4 mg/dL (ref 8.9–10.3)
Chloride: 108 mmol/L (ref 98–111)
Creatinine, Ser: 0.99 mg/dL (ref 0.44–1.00)
GFR, Estimated: 59 mL/min — ABNORMAL LOW (ref >60)
Glucose, Bld: 109 mg/dL — ABNORMAL HIGH (ref 70–99)
Potassium: 4 mmol/L (ref 3.5–5.1)
Sodium: 138 mmol/L (ref 135–145)

## 2021-08-12 SURGERY — IRRIGATION AND DEBRIDEMENT WOUND
Anesthesia: General | Site: Leg Lower | Laterality: Left

## 2021-08-12 MED ORDER — FENTANYL CITRATE PF 50 MCG/ML IJ SOSY: 25.0000 ug | PREFILLED_SYRINGE | INTRAMUSCULAR | Status: DC | PRN

## 2021-08-12 MED ORDER — CHLORHEXIDINE GLUCONATE 0.12 % MT SOLN
15.0000 mL | Freq: Once | OROMUCOSAL | Status: AC
  Administered 2021-08-12: 15 mL via OROMUCOSAL

## 2021-08-12 MED ORDER — FAMOTIDINE IN NACL 20-0.9 MG/50ML-% IV SOLN
20.0000 mg | Freq: Once | INTRAVENOUS | Status: AC
  Administered 2021-08-12: 20 mg via INTRAVENOUS
  Filled 2021-08-12 (×2): qty 50

## 2021-08-12 MED ORDER — EPHEDRINE SULFATE-NACL 50-0.9 MG/10ML-% IV SOSY
PREFILLED_SYRINGE | INTRAVENOUS | Status: DC | PRN
  Administered 2021-08-12: 15 mg via INTRAVENOUS
  Administered 2021-08-12: 10 mg via INTRAVENOUS

## 2021-08-12 MED ORDER — OXYCODONE-ACETAMINOPHEN 5-325 MG PO TABS: 1.0000 | ORAL_TABLET | Freq: Three times a day (TID) | ORAL | 0 refills | Status: DC | PRN

## 2021-08-12 MED ORDER — FENTANYL CITRATE PF 50 MCG/ML IJ SOSY: 25.0000 ug | PREFILLED_SYRINGE | Freq: Once | INTRAMUSCULAR | Status: DC

## 2021-08-12 MED ORDER — PHENYLEPHRINE 80 MCG/ML (10ML) SYRINGE FOR IV PUSH (FOR BLOOD PRESSURE SUPPORT)
PREFILLED_SYRINGE | INTRAVENOUS | Status: AC
  Filled 2021-08-12: qty 20

## 2021-08-12 MED ORDER — LIDOCAINE HCL (PF) 2 % IJ SOLN
INTRAMUSCULAR | Status: AC
  Filled 2021-08-12: qty 5

## 2021-08-12 MED ORDER — ACETAMINOPHEN 10 MG/ML IV SOLN
INTRAVENOUS | Status: AC
  Filled 2021-08-12: qty 100

## 2021-08-12 MED ORDER — FENTANYL CITRATE PF 50 MCG/ML IJ SOSY
PREFILLED_SYRINGE | INTRAMUSCULAR | Status: AC
  Administered 2021-08-12: 50 ug via INTRAVENOUS
  Filled 2021-08-12: qty 1

## 2021-08-12 MED ORDER — ROCURONIUM BROMIDE 10 MG/ML (PF) SYRINGE
PREFILLED_SYRINGE | INTRAVENOUS | Status: AC
  Filled 2021-08-12: qty 20

## 2021-08-12 MED ORDER — FENTANYL CITRATE PF 50 MCG/ML IJ SOSY
50.0000 ug | PREFILLED_SYRINGE | Freq: Once | INTRAMUSCULAR | Status: AC
  Administered 2021-08-12: 50 ug via INTRAVENOUS
  Filled 2021-08-12: qty 1

## 2021-08-12 MED ORDER — PROPOFOL 10 MG/ML IV BOLUS
INTRAVENOUS | Status: DC | PRN
  Administered 2021-08-12: 170 mg via INTRAVENOUS

## 2021-08-12 MED ORDER — MIDAZOLAM HCL 2 MG/2ML IJ SOLN
INTRAMUSCULAR | Status: AC
  Filled 2021-08-12: qty 2

## 2021-08-12 MED ORDER — SODIUM CHLORIDE 0.9 % IR SOLN
Status: DC | PRN
  Administered 2021-08-12: 3000 mL

## 2021-08-12 MED ORDER — PHENYLEPHRINE HCL (PRESSORS) 10 MG/ML IV SOLN
INTRAVENOUS | Status: AC
  Filled 2021-08-12: qty 1

## 2021-08-12 MED ORDER — ONDANSETRON HCL 4 MG/2ML IJ SOLN
INTRAMUSCULAR | Status: AC
  Filled 2021-08-12: qty 2

## 2021-08-12 MED ORDER — PROPOFOL 10 MG/ML IV BOLUS
INTRAVENOUS | Status: AC
  Filled 2021-08-12: qty 20

## 2021-08-12 MED ORDER — EPHEDRINE 5 MG/ML INJ
INTRAVENOUS | Status: AC
  Filled 2021-08-12: qty 10

## 2021-08-12 MED ORDER — SUCCINYLCHOLINE CHLORIDE 200 MG/10ML IV SOSY
PREFILLED_SYRINGE | INTRAVENOUS | Status: AC
  Filled 2021-08-12: qty 10

## 2021-08-12 MED ORDER — ONDANSETRON HCL 4 MG/2ML IJ SOLN: 4.0000 mg | Freq: Once | INTRAMUSCULAR | Status: DC | PRN

## 2021-08-12 MED ORDER — FENTANYL CITRATE (PF) 100 MCG/2ML IJ SOLN
INTRAMUSCULAR | Status: DC | PRN
  Administered 2021-08-12: 25 ug via INTRAVENOUS
  Administered 2021-08-12: 50 ug via INTRAVENOUS
  Administered 2021-08-12: 25 ug via INTRAVENOUS

## 2021-08-12 MED ORDER — SUCCINYLCHOLINE CHLORIDE 200 MG/10ML IV SOSY
PREFILLED_SYRINGE | INTRAVENOUS | Status: DC | PRN
Start: 2021-08-12 — End: 2021-08-12
  Administered 2021-08-12: 140 mg via INTRAVENOUS

## 2021-08-12 MED ORDER — 0.9 % SODIUM CHLORIDE (POUR BTL) OPTIME
TOPICAL | Status: DC | PRN
  Administered 2021-08-12: 1000 mL

## 2021-08-12 MED ORDER — PHENYLEPHRINE HCL-NACL 20-0.9 MG/250ML-% IV SOLN
INTRAVENOUS | Status: DC | PRN
  Administered 2021-08-12: 75 ug/min via INTRAVENOUS

## 2021-08-12 MED ORDER — DEXAMETHASONE SODIUM PHOSPHATE 10 MG/ML IJ SOLN
INTRAMUSCULAR | Status: AC
  Filled 2021-08-12: qty 1

## 2021-08-12 MED ORDER — AMOXICILLIN-POT CLAVULANATE 875-125 MG PO TABS
1.0000 | ORAL_TABLET | Freq: Two times a day (BID) | ORAL | 0 refills | Status: AC
Start: 2021-08-12 — End: 2021-08-19

## 2021-08-12 MED ORDER — DEXAMETHASONE SODIUM PHOSPHATE 10 MG/ML IJ SOLN
INTRAMUSCULAR | Status: DC | PRN
  Administered 2021-08-12: 5 mg via INTRAVENOUS

## 2021-08-12 MED ORDER — LACTATED RINGERS IV SOLN: INTRAVENOUS | Status: DC

## 2021-08-12 MED ORDER — OXYCODONE HCL 5 MG PO TABS: 5.0000 mg | ORAL_TABLET | Freq: Once | ORAL | Status: DC | PRN

## 2021-08-12 MED ORDER — SUGAMMADEX SODIUM 500 MG/5ML IV SOLN
INTRAVENOUS | Status: AC
  Filled 2021-08-12: qty 5

## 2021-08-12 MED ORDER — OXYCODONE HCL 5 MG/5ML PO SOLN: 5.0000 mg | Freq: Once | ORAL | Status: DC | PRN

## 2021-08-12 MED ORDER — METHOCARBAMOL 500 MG PO TABS
500.0000 mg | ORAL_TABLET | Freq: Three times a day (TID) | ORAL | 0 refills | Status: DC | PRN
Start: 2021-08-12 — End: 2022-04-07

## 2021-08-12 MED ORDER — SODIUM CHLORIDE 0.9 % IV SOLN
3.0000 g | Freq: Once | INTRAVENOUS | Status: AC
  Administered 2021-08-12: 3 g via INTRAVENOUS
  Filled 2021-08-12: qty 8

## 2021-08-12 MED ORDER — PHENYLEPHRINE 80 MCG/ML (10ML) SYRINGE FOR IV PUSH (FOR BLOOD PRESSURE SUPPORT)
PREFILLED_SYRINGE | INTRAVENOUS | Status: DC | PRN
  Administered 2021-08-12: 160 ug via INTRAVENOUS
  Administered 2021-08-12: 80 ug via INTRAVENOUS
  Administered 2021-08-12: 160 ug via INTRAVENOUS

## 2021-08-12 MED ORDER — FAMOTIDINE 20 MG PO TABS
20.0000 mg | ORAL_TABLET | Freq: Once | ORAL | Status: DC
Start: 2021-08-12 — End: 2021-08-12

## 2021-08-12 MED ORDER — MIDAZOLAM HCL 2 MG/2ML IJ SOLN
INTRAMUSCULAR | Status: DC | PRN
  Administered 2021-08-12 (×3): .5 mg via INTRAVENOUS

## 2021-08-12 MED ORDER — FENTANYL CITRATE (PF) 100 MCG/2ML IJ SOLN
INTRAMUSCULAR | Status: AC
  Filled 2021-08-12: qty 2

## 2021-08-12 MED ORDER — ACETAMINOPHEN 500 MG PO TABS
500.0000 mg | ORAL_TABLET | Freq: Three times a day (TID) | ORAL | 0 refills | Status: DC
Start: 2021-08-12 — End: 2022-04-07

## 2021-08-12 MED ORDER — ONDANSETRON HCL 4 MG/2ML IJ SOLN
INTRAMUSCULAR | Status: DC | PRN
  Administered 2021-08-12: 4 mg via INTRAVENOUS

## 2021-08-12 MED ORDER — ACETAMINOPHEN 10 MG/ML IV SOLN
1000.0000 mg | Freq: Once | INTRAVENOUS | Status: DC | PRN
  Administered 2021-08-12: 1000 mg via INTRAVENOUS

## 2021-08-12 MED ORDER — LIDOCAINE 2% (20 MG/ML) 5 ML SYRINGE
INTRAMUSCULAR | Status: DC | PRN
  Administered 2021-08-12: 100 mg via INTRAVENOUS

## 2021-08-12 SURGICAL SUPPLY — 47 items
BAG COUNTER SPONGE SURGICOUNT (BAG) IMPLANT
BAG SPEC THK2 15X12 ZIP CLS (MISCELLANEOUS)
BAG SPNG CNTER NS LX DISP (BAG)
BAG ZIPLOCK 12X15 (MISCELLANEOUS) ×1 IMPLANT
BANDAGE ESMARK 6X9 LF (GAUZE/BANDAGES/DRESSINGS) ×1 IMPLANT
BNDG CMPR 9X6 STRL LF SNTH (GAUZE/BANDAGES/DRESSINGS)
BNDG ESMARK 6X9 LF (GAUZE/BANDAGES/DRESSINGS)
BNDG GAUZE ELAST 4 BULKY (GAUZE/BANDAGES/DRESSINGS) ×2 IMPLANT
COVER SURGICAL LIGHT HANDLE (MISCELLANEOUS) ×2 IMPLANT
CUFF TOURN SGL QUICK 18X4 (TOURNIQUET CUFF) IMPLANT
CUFF TOURN SGL QUICK 24 (TOURNIQUET CUFF)
CUFF TOURN SGL QUICK 34 (TOURNIQUET CUFF)
CUFF TRNQT CYL 24X4X16.5-23 (TOURNIQUET CUFF) IMPLANT
CUFF TRNQT CYL 34X4.125X (TOURNIQUET CUFF) IMPLANT
DRAIN PENROSE 0.5X18 (DRAIN) ×1 IMPLANT
DRAPE SHEET LG 3/4 BI-LAMINATE (DRAPES) ×1 IMPLANT
DRAPE U-SHAPE 47X51 STRL (DRAPES) ×1 IMPLANT
DRSG MEPITEL 8X12 (GAUZE/BANDAGES/DRESSINGS) ×1 IMPLANT
DRSG PAD ABDOMINAL 8X10 ST (GAUZE/BANDAGES/DRESSINGS) ×3 IMPLANT
DURAPREP 26ML APPLICATOR (WOUND CARE) ×2 IMPLANT
ELECT REM PT RETURN 15FT ADLT (MISCELLANEOUS) ×2 IMPLANT
GAUZE SPONGE 4X4 12PLY STRL (GAUZE/BANDAGES/DRESSINGS) ×4 IMPLANT
GLOVE BIO SURGEON STRL SZ7.5 (GLOVE) ×2 IMPLANT
GLOVE BIO SURGEON STRL SZ8 (GLOVE) ×2 IMPLANT
GLOVE BIOGEL PI IND STRL 8 (GLOVE) ×2 IMPLANT
GLOVE BIOGEL PI INDICATOR 8 (GLOVE) ×2
GOWN STRL REUS W/ TWL LRG LVL3 (GOWN DISPOSABLE) ×1 IMPLANT
GOWN STRL REUS W/ TWL XL LVL3 (GOWN DISPOSABLE) ×1 IMPLANT
GOWN STRL REUS W/TWL LRG LVL3 (GOWN DISPOSABLE) ×2
GOWN STRL REUS W/TWL XL LVL3 (GOWN DISPOSABLE) ×2
HANDPIECE INTERPULSE COAX TIP (DISPOSABLE) ×2
KIT BASIN OR (CUSTOM PROCEDURE TRAY) ×2 IMPLANT
KIT TURNOVER KIT A (KITS) IMPLANT
MANIFOLD NEPTUNE II (INSTRUMENTS) ×2 IMPLANT
PACK ORTHO EXTREMITY (CUSTOM PROCEDURE TRAY) ×1 IMPLANT
PACK TOTAL JOINT (CUSTOM PROCEDURE TRAY) ×1 IMPLANT
PAD CAST 4YDX4 CTTN HI CHSV (CAST SUPPLIES) ×1 IMPLANT
PADDING CAST COTTON 4X4 STRL (CAST SUPPLIES)
PROTECTOR NERVE ULNAR (MISCELLANEOUS) ×1 IMPLANT
SET HNDPC FAN SPRY TIP SCT (DISPOSABLE) ×1 IMPLANT
SUT ETHILON 2 0 PSLX (SUTURE) ×2 IMPLANT
SUT MNCRL AB 4-0 PS2 18 (SUTURE) ×1 IMPLANT
SUT PDS AB 2-0 CT2 27 (SUTURE) ×1 IMPLANT
SUT VIC AB 2-0 CT1 27 (SUTURE)
SUT VIC AB 2-0 CT1 TAPERPNT 27 (SUTURE) ×2 IMPLANT
SYR CONTROL 10ML LL (SYRINGE) ×2 IMPLANT
TOWEL OR 17X26 10 PK STRL BLUE (TOWEL DISPOSABLE) ×4 IMPLANT

## 2021-08-12 NOTE — Transfer of Care (Signed)
Immediate Anesthesia Transfer of Care Note  Patient: [name]  Procedure(s) Performed: IRRIGATION AND [place] OF LEFT LEG WOUNDS (Left: Leg Lower)  Patient Location: PACU  Anesthesia Type:General  Level of Consciousness: awake and alert   Airway & Oxygen Therapy: Patient Spontanous Breathing and Patient connected to face mask oxygen  Post-op Assessment: Report given to RN and Post -op Vital signs reviewed and stable  Post vital signs: Reviewed and stable  Last Vitals:  Vitals Value Taken Time  BP 108/68 08/12/21 2027  Temp    Pulse 81 08/12/21 2028  Resp 22 08/12/21 2028  SpO2 100 % 08/12/21 2028  Vitals shown include unvalidated device data.  Last Pain:  Vitals:   08/12/21 1910  TempSrc:   PainSc: 0-No pain         Complications: No notable events documented.

## 2021-08-12 NOTE — Discharge Instructions (Addendum)
[facility] Discharge Instructions   General Discharge Instructions  WEIGHT BEARING STATUS: WEIGHT BEARING AS TOLERATED IN CAM BOOT.  BOOT ONLY NEEDS TO BE ON WHEN WALKING   RANGE OF MOTION/ACTIVITY: gentle ankle motion is ok.   Wound Care: daily wound care starting on 08/15/2021. See below   Discharge Wound Care Instructions  Do NOT apply any ointments, solutions or lotions to pin sites or surgical wounds.  These prevent needed drainage and even though solutions like hydrogen peroxide kill bacteria, they also damage cells lining the pin sites that help fight infection.  Applying lotions or ointments can keep the wounds moist and can cause them to breakdown and open up as well. This can increase the risk for infection. When in doubt call the office.  Surgical incisions should be dressed daily starting on 08/15/2021  If any drainage is noted, use one layer of Mepitel, then gauze, Kerlix, and an ace wrap. The mepitel is the white silicone nonstick dressing that is directly on the skin    [email or web address]   These dressing supplies should be available at local medical supply stores ([facility] [employer], [facility], etc). They are not usually carried at places like [facility], [facility], [employer], etc  Once the incision is completely dry and without drainage, it may be left open to air out.  Showering may begin 36-48 hours later.  Cleaning gently with soap and water.  Diet: as you were eating previously.  Can use over the counter stool softeners and bowel preparations, such as Miralax, to help with bowel movements.  Narcotics can be constipating.  Be sure to drink plenty of fluids  PAIN MEDICATION USE AND EXPECTATIONS  You have likely been given narcotic medications to help control your pain.  After a traumatic event that results in an fracture (broken bone) with or without surgery, it is ok to  use narcotic pain medications to help control one's pain.  We understand that everyone responds to pain differently and each individual patient will be evaluated on a regular basis for the continued need for narcotic medications. Ideally, narcotic medication use should last no more than 6-8 weeks (coinciding with fracture healing).   As a patient it is your responsibility as well to monitor narcotic medication use and report the amount and frequency you use these medications when you come to your office visit.   We would also advise that if you are using narcotic medications, you should take a dose prior to therapy to maximize you participation.  IF YOU ARE ON NARCOTIC MEDICATIONS IT IS NOT PERMISSIBLE TO OPERATE A MOTOR VEHICLE (MOTORCYCLE/CAR/TRUCK/MOPED) OR HEAVY MACHINERY DO NOT MIX NARCOTICS WITH OTHER CNS (CENTRAL NERVOUS SYSTEM) DEPRESSANTS SUCH AS ALCOHOL   POST-OPERATIVE OPIOID TAPER INSTRUCTIONS: It is important to wean off of your opioid medication as soon as possible. If you do not need pain medication after your surgery it is ok to stop day one. Opioids include: Codeine, Hydrocodone(Norco, Vicodin), Oxycodone(Percocet, oxycontin) and hydromorphone amongst others.  Long term and even short term use of opiods can cause: Increased pain response Dependence Constipation Depression Respiratory depression And more.  Withdrawal symptoms can include Flu like symptoms Nausea, vomiting And more Techniques to manage these symptoms Hydrate well Eat regular healthy meals Stay active Use relaxation techniques(deep breathing, meditating, yoga) Do Not substitute Alcohol to help with tapering If you have been on opioids for less than two weeks and do not have pain than it is ok to stop all together.  Plan to  wean off of opioids This plan should start within one week post op of your fracture surgery  Maintain the same interval or time between taking each dose and first decrease the dose.   Cut the total daily intake of opioids by one tablet each day Next start to increase the time between doses. The last dose that should be eliminated is the evening dose.    STOP SMOKING OR USING NICOTINE PRODUCTS!!!!  As discussed nicotine severely impairs your body's ability to heal surgical and traumatic wounds but also impairs bone healing.  Wounds and bone heal by forming microscopic blood vessels (angiogenesis) and nicotine is a vasoconstrictor (essentially, shrinks blood vessels).  Therefore, if vasoconstriction occurs to these microscopic blood vessels they essentially disappear and are unable to deliver necessary nutrients to the healing tissue.  This is one modifiable factor that you can do to dramatically increase your chances of healing your injury.    (This means no smoking, no nicotine gum, patches, etc)  DO NOT USE NONSTEROIDAL ANTI-INFLAMMATORY DRUGS (NSAID'S)  Using products such as Advil (ibuprofen), Aleve (naproxen), Motrin (ibuprofen) for additional pain control during fracture healing can delay and/or prevent the healing response.  If you would like to take over the counter (OTC) medication, Tylenol (acetaminophen) is ok.  However, some narcotic medications that are given for pain control contain acetaminophen as well. Therefore, you should not exceed more than 4000 mg of tylenol in a day if you do not have liver disease.  Also note that there are may OTC medicines, such as cold medicines and allergy medicines that my contain tylenol as well.  If you have any questions about medications and/or interactions please ask your doctor/PA or your pharmacist.      ICE AND ELEVATE INJURED/OPERATIVE EXTREMITY  Using ice and elevating the injured extremity above your heart can help with swelling and pain control.  Icing in a pulsatile fashion, such as 20 minutes on and 20 minutes off, can be followed.    Do not place ice directly on skin. Make sure there is a barrier between to skin and the  ice pack.    Using frozen items such as frozen peas works well as the conform nicely to the are that needs to be iced.  USE AN ACE WRAP OR TED HOSE FOR SWELLING CONTROL  In addition to icing and elevation, Ace wraps or TED hose are used to help limit and resolve swelling.  It is recommended to use Ace wraps or TED hose until you are informed to stop.    When using Ace Wraps start the wrapping distally (farthest away from the body) and wrap proximally (closer to the body)   Example: If you had surgery on your leg or thing and you do not have a splint on, start the ace wrap at the toes and work your way up to the thigh        If you had surgery on your upper extremity and do not have a splint on, start the ace wrap at your fingers and work your way up to the upper arm  IF YOU ARE IN A SPLINT OR CAST DO NOT [place]   If your splint gets wet for any reason please contact the office immediately. You may shower in your splint or cast as long as you keep it dry.  This can be done by wrapping in a cast cover or garbage back (or similar)  Do Not stick any thing  down your splint or cast such as pencils, money, or hangers to try and scratch yourself with.  If you feel itchy take benadryl as prescribed on the bottle for itching  IF YOU ARE IN A CAM BOOT (BLACK BOOT)  You may remove boot periodically. Perform daily dressing changes as noted below.  Wash the liner of the boot regularly and wear a sock when wearing the boot. It is recommended that you sleep in the boot until told otherwise    Call office for the following: Temperature greater than 101F Persistent nausea and vomiting Severe uncontrolled pain Redness, tenderness, or signs of infection (pain, swelling, redness, odor or green/yellow discharge around the site) Difficulty breathing, headache or visual disturbances Hives Persistent dizziness or light-headedness Extreme fatigue Any other questions or concerns you may have after  discharge  In an emergency, call 911 or go to an Emergency Department at a nearby hospital  HELPFUL INFORMATION  If you had a block, it will wear off between 8-24 hrs postop typically.  This is period when your pain may go from nearly zero to the pain you would have had postop without the block.  This is an abrupt transition but nothing dangerous is happening.  You may take an extra dose of narcotic when this happens.  You should wean off your narcotic medicines as soon as you are able.  Most patients will be off or using minimal narcotics before their first postop appointment.   We suggest you use the pain medication the first night prior to going to bed, in order to ease any pain when the anesthesia wears off. You should avoid taking pain medications on an empty stomach as it will make you nauseous.  Do not drink alcoholic beverages or take illicit drugs when taking pain medications.  In most states it is against the law to drive while you are in a splint or sling.  And certainly against the law to drive while taking narcotics.  You may return to work/school in the next couple of days when you feel up to it.   Pain medication may make you constipated.  Below are a few solutions to try in this order: Decrease the amount of pain medication if you aren't having pain. Drink lots of decaffeinated fluids. Drink prune juice and/or each dried prunes  If the first 3 don't work start with additional solutions Take Colace - an over-the-counter stool softener Take Senokot - an over-the-counter laxative Take Miralax - a stronger over-the-counter laxative     CALL THE OFFICE WITH ANY QUESTIONS OR CONCERNS: [phone]   VISIT OUR WEBSITE FOR ADDITIONAL INFORMATION: [email or web address]

## 2021-08-12 NOTE — ED Provider Notes (Signed)
[facility] DEPT Provider Note   CSN: [id number] Arrival date & time: 08/12/21  1552     History  Chief Complaint  Patient presents with   Animal Bite    Tina Owen is a 76 y.o. female who presents today for evaluation of a dog bite. She states that the dog that bit her was her own dog, up-to-date on all vaccines including rabies.  She states that the dog bit her on the left leg.  She denies falling or striking her head.  She denies any other injuries.  She states that there was a large amount of blood left noting "it filled my slipper."  She states that her tetanus is up-to-date, chart review shows that her last Tdap was 11/20/2019.  Husband, who is at bedside, states that animal control took the dog and he just signed the papers to euthanize the dog.   HPI     Home Medications Prior to Admission medications   Medication Sig Start Date End Date Taking? Authorizing Provider  acetaminophen (TYLENOL) 500 MG tablet Take 1 tablet (500 mg total) by mouth every 8 (eight) hours. 08/12/21  Yes Ainsley Spinner, PA-C  amoxicillin-clavulanate (AUGMENTIN) 875-125 MG tablet Take 1 tablet by mouth 2 (two) times daily for 7 days. 08/12/21 08/19/21 Yes Ainsley Spinner, PA-C  aspirin EC 81 MG tablet Take 81 mg by mouth daily.   Yes [provider]  atorvastatin (LIPITOR) 80 MG tablet TAKE 1 TABLET(80 MG) BY MOUTH DAILY 06/09/21  Yes Denita Lung, MD  Calcium Carbonate-Vitamin D (CALCIUM PLUS VITAMIN D PO) Take 1 tablet by mouth at bedtime.   Yes [provider]  chlorthalidone (HYGROTON) 25 MG tablet TAKE 1/2 TABLET(12.5 MG) BY MOUTH DAILY 03/31/21  Yes Denita Lung, MD  ezetimibe (ZETIA) 10 MG tablet TAKE 1 TABLET(10 MG) BY MOUTH DAILY 03/31/21  Yes Denita Lung, MD  levothyroxine (SYNTHROID) 100 MCG tablet Take 1 tablet (100 mcg total) by mouth daily before breakfast. 03/31/21  Yes Denita Lung, MD  methocarbamol (ROBAXIN) 500 MG tablet Take 1  tablet (500 mg total) by mouth every 8 (eight) hours as needed for muscle spasms. 08/12/21  Yes Ainsley Spinner, PA-C  Multiple Vitamin (MULTIVITAMIN) tablet Take 1 tablet by mouth daily.   Yes [provider]  nebivolol (BYSTOLIC) 10 MG tablet TAKE 1 TABLET(10 MG) BY MOUTH DAILY 03/31/21  Yes Denita Lung, MD  oxyCODONE-acetaminophen (PERCOCET/ROXICET) 5-325 MG tablet Take 1-2 tablets by mouth every 8 (eight) hours as needed for severe pain. 08/12/21  Yes Ainsley Spinner, PA-C      Allergies    Tape    Review of Systems   Review of Systems See above Physical Exam Updated Vital Signs BP (!) 95/58   Pulse 80   Temp 98.1 F (36.7 C)   Resp 16   Ht 5\' 7"  (1.702 m)   Wt 71.2 kg   LMP 03/23/1994 (Approximate)   SpO2 100%   BMI 24.59 kg/m  Physical Exam Vitals and nursing note reviewed.  Constitutional:      General: She is not in acute distress. HENT:     Head: Normocephalic and atraumatic.  Cardiovascular:     Rate and Rhythm: Normal rate.     Pulses: Normal pulses.     Comments: 2+ DP/PT pulse left foot Pulmonary:     Effort: Pulmonary effort is normal. No respiratory distress.  Musculoskeletal:     Cervical back: No rigidity.  Comments: Patient has full AROM of the left ankle and knee.   Skin:    General: Skin is warm and dry.     Comments: Please see clinical image.  There is a large laceration over the anterior left lower leg and a laceration over the left posterior lower leg.  Neurological:     Mental Status: She is alert. Mental status is at baseline.     Sensory: No sensory deficit.     Comments: Awake and alert, answers all questions appropriately.  Speech is not slurred.    Psychiatric:        Mood and Affect: Mood normal.           ED Results / Procedures / Treatments   Labs (all labs ordered are listed, but only abnormal results are displayed) Labs Reviewed  CBC WITH DIFFERENTIAL/PLATELET - Abnormal; Notable for the following components:       Result Value   WBC 13.7 (*)    Neutro Abs 10.7 (*)    Monocytes Absolute 1.1 (*)    Abs Immature Granulocytes 0.12 (*)    All other components within normal limits  BASIC METABOLIC PANEL - Abnormal; Notable for the following components:   Glucose, Bld 109 (*)    GFR, Estimated 59 (*)    All other components within normal limits    EKG None  Radiology DG Tibia/Fibula Left  Result Date: 08/12/2021 CLINICAL DATA:  Dog bite to the left lower extremity with multiple large wounds and tissue loss. EXAM: LEFT TIBIA AND FIBULA - 2 VIEW COMPARISON:  Left ankle 11/20/2019 FINDINGS: Soft tissue injury to the mid to distal left lower leg with soft tissue defect and soft tissue gas demonstrated posteriorly and medially. This is consistent with history of dog bite. No radiopaque soft tissue foreign bodies are identified. Bones appear intact without evidence of acute fracture or dislocation. IMPRESSION: Soft tissue injury to the posteromedial distal left lower leg with soft tissue gas present. No radiopaque foreign bodies. No acute bony abnormalities. Electronically Signed   By: Lucienne Capers M.D.   On: 08/12/2021 17:09    Procedures Irrigation  Date/Time: 08/12/2021 9:40 PM Performed by: Lorin Glass, PA-C Authorized by: Lorin Glass, PA-C  Consent: Verbal consent obtained. Risks and benefits: risks, benefits and alternatives were discussed Consent given by: patient Patient understanding: patient states understanding of the procedure being performed Local anesthesia used: no  Anesthesia: Local anesthesia used: no Patient tolerance: patient tolerated the procedure well with no immediate complications Comments: Wounds irrigated with 1 liter of NS at bedside on initial evaluation.     {Document cardiac monitor, telemetry assessment procedure when appropriate  Medications Ordered in ED Medications  fentaNYL (SUBLIMAZE) injection 50 mcg (50 mcg Intravenous Given 08/12/21 1735)   Ampicillin-Sulbactam (UNASYN) 3 g in sodium chloride 0.9 % 100 mL IVPB (0 g Intravenous Stopped 08/12/21 1821)    ED Course/ Medical Decision Making/ A&P Clinical Course as of 08/12/21 2143  Tue Aug 12, 2021  1754 I spoke with Dr. Marcelino Scot who agrees patient needs to go to the OR.  [EH]    Clinical Course User Index [EH] Lorin Glass, PA-C                           Medical Decision Making Patient is a 76 year old woman who presents today for evaluation after a dog bite.  She reports a isolated injury to her left lower leg.  On exam she has 2 large lacerations, one over the anterior leg and one over the posterior leg with significant gaping and concern for tissue/muscle loss.  The wound tracks down to the bone. Wound was irrigated by myself in the emergency room. X-rays are obtained without fracture noted. Baseline labs were obtained, her white count is slightly elevated however I suspect that this is a stress reaction as she denies any prodromal symptoms and the bite occurred only today.  BMP is unremarkable.  Amount and/or Complexity of Data Reviewed Labs: ordered. Radiology: ordered and independent interpretation performed. Discussion of management or test interpretation with external provider(s): Dr. Marcelino Scot of orthopedics- states that he will take patient to the OR for a wash out, and may admit her over night.   Risk Prescription drug management. Parenteral controlled substances. Decision regarding hospitalization. Risk Details: Patient admitted for emergency surgery IV antibiotics     Final Clinical Impression(s) / ED Diagnoses Final diagnoses:  Dog bite, initial encounter    Rx / DC Orders ED Discharge Orders          Ordered    amoxicillin-clavulanate (AUGMENTIN) 875-125 MG tablet  2 times daily        08/12/21 2025    oxyCODONE-acetaminophen (PERCOCET/ROXICET) 5-325 MG tablet  Every 8 hours PRN        08/12/21 2025    acetaminophen (TYLENOL) 500 MG tablet   Every 8 hours        08/12/21 2025    methocarbamol (ROBAXIN) 500 MG tablet  Every 8 hours PRN        08/12/21 2025              Lorin Glass, PA-C 08/12/21 2143    [place], Alvin Critchley, DO 08/12/21 2312

## 2021-08-12 NOTE — Anesthesia Preprocedure Evaluation (Addendum)
Anesthesia Evaluation  Patient identified by MRN, date of birth, ID band Patient awake    Reviewed: Allergy & Precautions, NPO status , Patient's Chart, lab work & pertinent test results  Airway Mallampati: II  TM Distance: <3 FB Neck ROM: Full    Dental  (+) Missing   Pulmonary neg pulmonary ROS, former smoker,    Pulmonary exam normal breath sounds clear to auscultation       Cardiovascular hypertension, Pt. on medications and Pt. on home beta blockers + Valvular Problems/Murmurs AI  Rhythm:Regular Rate:Normal + Systolic murmurs 1. Left ventricular ejection fraction, by visual estimation, is 60 to  65%. The left ventricle has normal function. There is no left ventricular  hypertrophy.  2. Left ventricular diastolic parameters are indeterminate.  3. The left ventricle has no regional wall motion abnormalities.  4. Global right ventricle has normal systolic function.The right  ventricular size is normal.  5. Left atrial size was mildly dilated.  6. Right atrial size was normal.  7. Mild mitral annular calcification.  8. The mitral valve is normal in structure. Mild mitral valve  regurgitation. No evidence of mitral stenosis.  9. The tricuspid valve is normal in structure.  10. The aortic valve is tricuspid. Aortic valve regurgitation is mild.  Mild to moderate aortic valve sclerosis/calcification without any evidence  of aortic stenosis.  11. The pulmonic valve was normal in structure. Pulmonic valve  regurgitation is not visualized.  12. The inferior vena cava is normal in size with greater than 50%  respiratory variability, suggesting right atrial pressure of 3 mmHg.  13. Normal LV function; mild AI and MR; mild LAE; GLS-18.8%   Neuro/Psych negative neurological ROS  negative psych ROS   GI/Hepatic negative GI ROS, Neg liver ROS,   Endo/Other  Hypothyroidism   Renal/GU negative Renal ROS  negative  genitourinary   Musculoskeletal negative musculoskeletal ROS (+)   Abdominal   Peds negative pediatric ROS (+)  Hematology negative hematology ROS (+)   Anesthesia Other Findings   Reproductive/Obstetrics negative OB ROS                           Anesthesia Physical Anesthesia Plan  ASA: 3  Anesthesia Plan: General   Post-op Pain Management: Minimal or no pain anticipated   Induction: Intravenous and Rapid sequence  PONV Risk Score and Plan: 3 and Ondansetron, Dexamethasone and Treatment may vary due to age or medical condition  Airway Management Planned: Oral ETT and Video Laryngoscope Planned  Additional Equipment:   Intra-op Plan:   Post-operative Plan: Extubation in OR  Informed Consent: I have reviewed the patients History and Physical, chart, labs and discussed the procedure including the risks, benefits and alternatives for the proposed anesthesia with the patient or authorized representative who has indicated his/her understanding and acceptance.     Dental advisory given  Plan Discussed with: CRNA and Surgeon  Anesthesia Plan Comments:       Anesthesia Quick Evaluation

## 2021-08-12 NOTE — Progress Notes (Signed)
Orthopedic Tech Progress Note Patient Details:  Tina Owen 1945-11-04 [id number]  Patient ID: [name], female   DOB: 01-31-1946, 76 y.o.   MRN: [id number] Patient states she already has a CAM walker at home from where she broke her ankle, I left a CAM with the PACU RN just incase, and told her to place it in the cabinet if the one the patient already has is the same thing as what I was going to give her.  Vernona Rieger 08/12/2021, 9:03 PM

## 2021-08-12 NOTE — ED Notes (Signed)
Short stay at bedside.

## 2021-08-12 NOTE — H&P (Signed)
[facility] H&P  Patient ID: Tina Owen MRN: [id number] DOB/AGE: 1945/12/10 76 y.o.  Chief Complaint: Dog bite left leg HPI: Tina Owen is an 76 y.o. female who sustained unprovoked attack from her [place] rescue dog. Denies other injury, loss of motion, loss of sensation. Pain is dull, aching, worse with motion. Patient received antibiotics in the ED with Unasyn.   Past Medical History:  Diagnosis Date   Allergy    SEASONAL   Bilateral hip pain    Blood transfusion without reported diagnosis    as an infant. 26 week old, 1947   Cancer [facility])    LUNG, 1998 - surgery, chemo, radiation   Cataract    Colonic polyp 2004   Dyslipidemia    Dyspnea    FHx: cardiovascular disease    Fibroid    Heart murmur    Hyperlipidemia    Hypertension    Hypothyroid    Osteopenia     Past Surgical History:  Procedure Laterality Date   BREAST CYST EXCISION Left    age 34-14   CATARACT EXTRACTION, BILATERAL  08/2016   CESAREAN SECTION  [id number]   x2   CHOLECYSTECTOMY  1998   COLONOSCOPY     LAPAROSCOPIC BILATERAL SALPINGO OOPHERECTOMY N/A 09/29/2016   Procedure: LAPAROSCOPIC BILATERAL SALPINGO OOPHORECTOMY WITH WASHINGS;  Surgeon: Megan Salon, MD;  Location: [facility];  Service: Gynecology;  Laterality: N/A;   LUNG CANCER SURGERY Left 1998   L upper lobectomy     PERICARDIAL WINDOW  2010   Dr Arlyce Dice   TRANSTHORACIC ECHOCARDIOGRAM  10/2010   EF>55%; LA mild-mod dilated; borderline RA englargement; mild MR, trace TR, mild AV regurg, trace PV regurg    TUBAL LIGATION  1990    Family History  Problem Relation Age of Onset   Hypertension Mother    Heart disease Mother    Hypertension Father    Heart disease Father    Heart disease Maternal Grandmother    Heart disease Maternal Grandfather    Colon cancer Brother        this was a half brother   Esophageal cancer Neg Hx    Stomach cancer Neg Hx    Rectal cancer Neg Hx     Social History:  reports that she quit smoking about 26 years ago. Her smoking use included cigarettes. She has never used smokeless tobacco. She reports current alcohol use of about 7.0 standard drinks per week. She reports that she does not use drugs.  Allergies:  Allergies  Allergen Reactions   Tape Itching    Redness and scars  PAPER TAPE IS OKAY    Medications Prior to Admission  Medication Sig Dispense Refill   aspirin EC 81 MG tablet Take 81 mg by mouth daily.     atorvastatin (LIPITOR) 80 MG tablet TAKE 1 TABLET(80 MG) BY MOUTH DAILY 90 tablet 1   Calcium Carbonate-Vitamin D (CALCIUM PLUS VITAMIN D PO) Take 1 tablet by mouth at bedtime.     chlorthalidone (HYGROTON) 25 MG tablet TAKE 1/2 TABLET(12.5 MG) BY MOUTH DAILY 45 tablet 3   ezetimibe (ZETIA) 10 MG tablet TAKE 1 TABLET(10 MG) BY MOUTH DAILY 90 tablet 3   levothyroxine (SYNTHROID) 100 MCG tablet Take 1 tablet (100 mcg total) by mouth daily before breakfast. 90 tablet 3   Multiple Vitamin (MULTIVITAMIN) tablet Take 1 tablet by mouth daily.     nebivolol (BYSTOLIC) 10 MG tablet TAKE 1 TABLET(10  MG) BY MOUTH DAILY 90 tablet 3    Results for orders placed or performed during the hospital encounter of 08/12/21 (from the past 48 hour(s))  CBC with Differential     Status: Abnormal   Collection Time: 08/12/21  4:36 PM  Result Value Ref Range   WBC 13.7 (H) 4.0 - 10.5 K/uL   RBC 4.18 3.87 - 5.11 MIL/uL   Hemoglobin 13.3 12.0 - 15.0 g/dL   HCT 37.4 36.0 - 46.0 %   MCV 89.5 80.0 - 100.0 fL   MCH 31.8 26.0 - 34.0 pg   MCHC 35.6 30.0 - 36.0 g/dL   RDW 12.4 11.5 - 15.5 %   Platelets 152 150 - 400 K/uL   nRBC 0.0 0.0 - 0.2 %   Neutrophils Relative % 78 %   Neutro Abs 10.7 (H) 1.7 - 7.7 K/uL   Lymphocytes Relative 12 %   Lymphs Abs 1.6 0.7 - 4.0 K/uL   Monocytes Relative 8 %   Monocytes Absolute 1.1 (H) 0.1 - 1.0 K/uL   Eosinophils Relative 1 %   Eosinophils Absolute 0.1 0.0 - 0.5 K/uL   Basophils Relative 0 %    Basophils Absolute 0.0 0.0 - 0.1 K/uL   Immature Granulocytes 1 %   Abs Immature Granulocytes 0.12 (H) 0.00 - 0.07 K/uL    Comment: Performed at [facility], [place] [address], [place], [place] [address]  Basic metabolic panel     Status: Abnormal   Collection Time: 08/12/21  4:36 PM  Result Value Ref Range   Sodium 138 135 - 145 mmol/L   Potassium 4.0 3.5 - 5.1 mmol/L   Chloride 108 98 - 111 mmol/L   CO2 23 22 - 32 mmol/L   Glucose, Bld 109 (H) 70 - 99 mg/dL    Comment: Glucose reference range applies only to samples taken after fasting for at least 8 hours.   BUN 22 8 - 23 mg/dL   Creatinine, Ser 0.99 0.44 - 1.00 mg/dL   Calcium 9.4 8.9 - 10.3 mg/dL   GFR, Estimated 59 (L) >60 mL/min    Comment: (NOTE) Calculated using the CKD-EPI Creatinine Equation (2021)    Anion gap 7 5 - 15    Comment: Performed at [facility], [place] [address], [place], [place] [address]   DG Tibia/Fibula Left  Result Date: 08/12/2021 CLINICAL DATA:  Dog bite to the left lower extremity with multiple large wounds and tissue loss. EXAM: LEFT TIBIA AND FIBULA - 2 VIEW COMPARISON:  Left ankle 11/20/2019 FINDINGS: Soft tissue injury to the mid to distal left lower leg with soft tissue defect and soft tissue gas demonstrated posteriorly and medially. This is consistent with history of dog bite. No radiopaque soft tissue foreign bodies are identified. Bones appear intact without evidence of acute fracture or dislocation. IMPRESSION: Soft tissue injury to the posteromedial distal left lower leg with soft tissue gas present. No radiopaque foreign bodies. No acute bony abnormalities. Electronically Signed   By: Lucienne Capers M.D.   On: 08/12/2021 17:09    ROS No recent fever, bleeding abnormalities, urologic dysfunction, GI problems, or weight gain.  Blood pressure (!) 96/58, pulse 65, temperature 98.5 F (36.9 C), temperature source Oral, resp. rate 15, height 5\' 7"  (1.702 m),  weight 71.2 kg, last menstrual period 03/23/1994, SpO2 100 %. Physical Exam NCAT, appears younger than stated age A&O x 4  No audible wheezing or chest retractions with breathing LLE Dressing intact, with  serosanguinous drainage  Edema/ swelling controlled  Sens: DPN, SPN, TN intact  Motor: EHL, FHL, and lessor toe ext and flex all intact  Brisk cap refill, warm to touch, DP 1+, PT 1+   Assessment/Plan  Dog bite left leg, 15 cm, complex and deep X-rays show no foreign body Heart murmur, MR HTN Labs WNL X-rays show no retained foreign body  I discussed with the patient the risks and benefits of surgery, including the possibility of infection, nerve injury, vessel injury, wound breakdown, DVT/ PE, loss of motion,  and need for further surgery among others.  She acknowledged these risks and wished to proceed.    Altamese [place], MD [facility], [facility] [phone]  08/12/2021, 7:06 PM  [facility] [place] [id number] [phone] (F)

## 2021-08-12 NOTE — Anesthesia Procedure Notes (Signed)
Procedure Name: Intubation Date/Time: 08/12/2021 7:20 PM Performed by: Cynda Familia, CRNA Pre-anesthesia Checklist: Patient identified, Emergency Drugs available, Suction available and Patient being monitored Patient Re-evaluated:Patient Re-evaluated prior to induction Oxygen Delivery Method: Circle System Utilized Preoxygenation: Pre-oxygenation with 100% oxygen Induction Type: IV induction, Rapid sequence and Cricoid Pressure applied Ventilation: Mask ventilation without difficulty Laryngoscope Size: Glidescope and 3 Grade View: Grade I Tube type: Oral Number of attempts: 1 Airway Equipment and Method: Stylet and Video-laryngoscopy Placement Confirmation: ETT inserted through vocal cords under direct vision, positive ETCO2 and breath sounds checked- equal and bilateral Secured at: 22 cm Tube secured with: Tape Dental Injury: Teeth and Oropharynx as per pre-operative assessment  Difficulty Due To: Difficulty was anticipated, Difficult Airway- due to reduced neck mobility, Difficult Airway- due to anterior larynx, Difficult Airway- due to limited oral opening and Difficult Airway- due to dentition Future Recommendations: Recommend- induction with short-acting agent, and alternative techniques readily available Comments: Consider DIFFICULT INTUBATION--- induction RSI- Rose-- intubation AM CRNA atraumatic--- teeth and mouth as preop blat BS-- many missing teeth-- poor dentition

## 2021-08-12 NOTE — ED Triage Notes (Signed)
Pt BIB EMS from home, c/o dog bite on left lower leg/ankle. 3-4 mm bite, bloody with puncture wound. Reported up-to-date  on vaccines.   BP 102/60 P 85 spO2 99% RA

## 2021-08-12 NOTE — ED Notes (Signed)
NPO status given to short stay via phone call at this time.

## 2021-08-13 ENCOUNTER — Encounter (HOSPITAL_COMMUNITY): Payer: Self-pay | Admitting: Orthopedic Surgery

## 2021-08-14 ENCOUNTER — Encounter: Payer: Self-pay | Admitting: Internal Medicine

## 2021-08-14 ENCOUNTER — Ambulatory Visit: Payer: Medicare PPO | Admitting: Internal Medicine

## 2021-08-14 VITALS — BP 128/79 | HR 72 | Ht 67.0 in | Wt 160.0 lb

## 2021-08-14 DIAGNOSIS — E782 Mixed hyperlipidemia: Secondary | ICD-10-CM

## 2021-08-14 DIAGNOSIS — R011 Cardiac murmur, unspecified: Secondary | ICD-10-CM

## 2021-08-14 DIAGNOSIS — I10 Essential (primary) hypertension: Secondary | ICD-10-CM

## 2021-08-14 NOTE — Patient Instructions (Signed)
Medication Instructions:  Your physician recommends that you continue on your current medications as directed. Please refer to the Current Medication list given to you today.  *If you need a refill on your cardiac medications before your next appointment, please call your pharmacy*   Lab Work: NONE ordered at this time of appointment   If you have labs (blood work) drawn today and your tests are completely normal, you will receive your results only by: [place] (if you have MyChart) OR A paper copy in the mail If you have any lab test that is abnormal or we need to change your treatment, we will call you to review the results.   Testing/Procedures: Your physician has requested that you have an echocardiogram. Echocardiography is a painless test that uses sound waves to create images of your heart. It provides your doctor with information about the size and shape of your heart and how well your heart's chambers and valves are working. This procedure takes approximately one hour. There are no restrictions for this procedure.   Please schedule at the [place] office   Follow-Up: At [facility], you and your health needs are our priority.  As part of our continuing mission to provide you with exceptional heart care, we have created designated Provider Care Teams.  These Care Teams include your primary Cardiologist (physician) and Advanced Practice Providers (APPs -  Physician Assistants and Nurse Practitioners) who all work together to provide you with the care you need, when you need it.    Your next appointment:   1 year(s)  The format for your next appointment:   In Person  Provider:   Pixie Casino, MD     Other Instructions   Important Information About Sugar

## 2021-08-14 NOTE — Progress Notes (Signed)
OFFICE NOTE  Chief Complaint:  Routine follow-up  Primary Care Physician: Denita Lung, MD  HPI:  Tina Owen is a pleasant 76 year old female previously followed by Dr. Rex Kras with a history of lung cancer status post radiation and chemotherapy. She also has a history of pericardial effusion which she developed almost 12 years after this episode. She underwent pericardial window and 2010 and has had no reoccurrence. She is on Lipitor for dyslipidemia without any issues and takes low-dose Bystolic. She has been exercising recently without any symptoms such as shortness of breath, worsening chest pain, presyncope or syncopal symptoms.  Tina Owen returns today for follow-up. She reports doing fairly well. She denies any chest pain or worsening shortness of breath. She does get a little short of breath particularly when walking up stairs and recently was in [place] and climbed up 4 flights of stairs and became short of breath. That however resolved quickly.  She had a recent echocardiogram which showed a preserved EF of 55-60%, mild mitral regurgitation which is stable, and no recurrent pericardial effusion. She is status post pericardial window. Blood pressure has been reportedly well controlled at home.  I saw Tina Owen back today in the office. She reports some mild shortness of breath is not necessarily any worse than it had been previously. Blood pressure is very well controlled. Her weight is stable. She denies any chest pain.  02/19/2016  Tina Owen was seen back in the office today. She had a recent echo which shows no change in her mild degree of AI and MR, with a preserved LVEF. She reports possibly worsening DOE- LV filling pressures are elevated on her echo. She denies chest pain. BP is elevated today as well at 166/86.  04/13/2016  Tina Owen was seen in the office today in follow-up. Overall she is doing very well. Previously I had started her on chlorthalidone in  addition to her medicines. Blood pressure today is very well controlled 118/82. She denies any worsening shortness of breath. She says after starting the medicine she did have some diuresis and lost some weight. BMI today is only 27. Overall she feels well. She recently went on a cruise and had no difficulty walking with or worsening shortness of breath going up and down stairs or climbing around the [employer] and [place].  04/21/2017  Tina Owen returns today for follow-up.  Overall she seems to be doing well.  Recently she has had some weight gain from 179 186 pounds.  She says she has been off of her chlorthalidone for the past 2 weeks because she ran out of medication.  This might explain a slight increase in her blood pressure today 140/80.  He denies any chest pain or shortness of breath.  EKG is normal sinus at 70.  04/11/2017  Tina Owen is seen today for routine follow-up.  Overall she is doing well.  She denies any chest pain or shortness of breath.  Blood pressure was normal today 132/80.  She has upcoming routine colonoscopy in February recently had wellness check with her primary care provider.  She is maintaining on daily chlorthalidone because of edema.  Her lipid profile was at goal.  04/17/2019  Tina Owen is seen today for annual follow-up.  Overall she is doing well.  She recently had a COVID-19 vaccine.  Unfortunately, her labs recently showed that her LDL cholesterol remains elevated at 122.  This is despite compliance with 80 mg atorvastatin.  She does  have some aortic sclerosis and aortic insufficiency by recent echo in December which showed normal LV function and no problems with recurrent pericardial effusion.  Her target LDL should be less than 70.  08/14/2021  Tina Owen returns today for follow-up.  Unfortunately she was recently in the ER after suffering a dog bite.  This required debridement and antibiotics which she is still receiving.  She has  follow-up I think tomorrow.  Otherwise she has been asymptomatic from a cardiac standpoint.  No chest pain or worsening shortness of breath.  She had seen Doreene Adas, PA-C without any significant issues although a repeat echo was recommended because of her history of aortic and mitral insufficiency and the fact that a loud murmur was heard.  Blood pressure appears well controlled today.  PMHx:  Past Medical History:  Diagnosis Date   Allergy    SEASONAL   Bilateral hip pain    Blood transfusion without reported diagnosis    as an infant. 34 week old, 1947   Cancer [facility])    LUNG, 1998 - surgery, chemo, radiation   Cataract    Colonic polyp 2004   Dyslipidemia    Dyspnea    FHx: cardiovascular disease    Fibroid    Heart murmur    Hyperlipidemia    Hypertension    Hypothyroid    Osteopenia     Past Surgical History:  Procedure Laterality Date   BREAST CYST EXCISION Left    age 60-14   CATARACT EXTRACTION, BILATERAL  08/2016   CESAREAN SECTION  4627,0350   x2   CHOLECYSTECTOMY  1998   COLONOSCOPY     INCISION AND DRAINAGE OF WOUND Left 08/12/2021   Procedure: IRRIGATION AND DEBRIDEMENT [place] OF LEFT LEG WOUNDS;  Surgeon: Altamese [place], MD;  Location: [facility];  Service: Orthopedics;  Laterality: Left;   LAPAROSCOPIC BILATERAL SALPINGO OOPHERECTOMY N/A 09/29/2016   Procedure: LAPAROSCOPIC BILATERAL SALPINGO OOPHORECTOMY WITH WASHINGS;  Surgeon: Megan Salon, MD;  Location: [facility];  Service: Gynecology;  Laterality: N/A;   LUNG CANCER SURGERY Left 1998   L upper lobectomy     PERICARDIAL WINDOW  2010   Dr Arlyce Dice   TRANSTHORACIC ECHOCARDIOGRAM  10/2010   EF>55%; LA mild-mod dilated; borderline RA englargement; mild MR, trace TR, mild AV regurg, trace PV regurg    TUBAL LIGATION  1990    FAMHx:  Family History  Problem Relation Age of Onset   Hypertension Mother    Heart disease Mother    Hypertension Father    Heart disease Father    Heart disease Maternal  Grandmother    Heart disease Maternal Grandfather    Colon cancer Brother        this was a half brother   Esophageal cancer Neg Hx    Stomach cancer Neg Hx    Rectal cancer Neg Hx     SOCHx:   reports that she quit smoking about 26 years ago. Her smoking use included cigarettes. She has never used smokeless tobacco. She reports current alcohol use of about 7.0 standard drinks per week. She reports that she does not use drugs.  ALLERGIES:  Allergies  Allergen Reactions   Tape Itching    Redness and scars  PAPER TAPE IS OKAY    ROS: Pertinent items noted in HPI and remainder of comprehensive ROS otherwise negative.  HOME MEDS: Current Outpatient Medications  Medication Sig Dispense Refill   acetaminophen (TYLENOL) 500 MG tablet Take 1 tablet (500  mg total) by mouth every 8 (eight) hours. 30 tablet 0   amoxicillin-clavulanate (AUGMENTIN) 875-125 MG tablet Take 1 tablet by mouth 2 (two) times daily for 7 days. 14 tablet 0   aspirin EC 81 MG tablet Take 81 mg by mouth daily.     atorvastatin (LIPITOR) 80 MG tablet TAKE 1 TABLET(80 MG) BY MOUTH DAILY 90 tablet 1   Calcium Carbonate-Vitamin D (CALCIUM PLUS VITAMIN D PO) Take 1 tablet by mouth at bedtime.     chlorthalidone (HYGROTON) 25 MG tablet TAKE 1/2 TABLET(12.5 MG) BY MOUTH DAILY 45 tablet 3   ezetimibe (ZETIA) 10 MG tablet TAKE 1 TABLET(10 MG) BY MOUTH DAILY 90 tablet 3   levothyroxine (SYNTHROID) 100 MCG tablet Take 1 tablet (100 mcg total) by mouth daily before breakfast. 90 tablet 3   methocarbamol (ROBAXIN) 500 MG tablet Take 1 tablet (500 mg total) by mouth every 8 (eight) hours as needed for muscle spasms. 40 tablet 0   Multiple Vitamin (MULTIVITAMIN) tablet Take 1 tablet by mouth daily.     nebivolol (BYSTOLIC) 10 MG tablet TAKE 1 TABLET(10 MG) BY MOUTH DAILY 90 tablet 3   oxyCODONE-acetaminophen (PERCOCET/ROXICET) 5-325 MG tablet Take 1-2 tablets by mouth every 8 (eight) hours as needed for severe pain. 30 tablet 0    No current facility-administered medications for this visit.    LABS/IMAGING: Results for orders placed or performed during the hospital encounter of 08/12/21 (from the past 48 hour(s))  CBC with Differential     Status: Abnormal   Collection Time: 08/12/21  4:36 PM  Result Value Ref Range   WBC 13.7 (H) 4.0 - 10.5 K/uL   RBC 4.18 3.87 - 5.11 MIL/uL   Hemoglobin 13.3 12.0 - 15.0 g/dL   HCT 37.4 36.0 - 46.0 %   MCV 89.5 80.0 - 100.0 fL   MCH 31.8 26.0 - 34.0 pg   MCHC 35.6 30.0 - 36.0 g/dL   RDW 12.4 11.5 - 15.5 %   Platelets 152 150 - 400 K/uL   nRBC 0.0 0.0 - 0.2 %   Neutrophils Relative % 78 %   Neutro Abs 10.7 (H) 1.7 - 7.7 K/uL   Lymphocytes Relative 12 %   Lymphs Abs 1.6 0.7 - 4.0 K/uL   Monocytes Relative 8 %   Monocytes Absolute 1.1 (H) 0.1 - 1.0 K/uL   Eosinophils Relative 1 %   Eosinophils Absolute 0.1 0.0 - 0.5 K/uL   Basophils Relative 0 %   Basophils Absolute 0.0 0.0 - 0.1 K/uL   Immature Granulocytes 1 %   Abs Immature Granulocytes 0.12 (H) 0.00 - 0.07 K/uL    Comment: Performed at [facility], [place] [address], [place], [place] [address]  Basic metabolic panel     Status: Abnormal   Collection Time: 08/12/21  4:36 PM  Result Value Ref Range   Sodium 138 135 - 145 mmol/L   Potassium 4.0 3.5 - 5.1 mmol/L   Chloride 108 98 - 111 mmol/L   CO2 23 22 - 32 mmol/L   Glucose, Bld 109 (H) 70 - 99 mg/dL    Comment: Glucose reference range applies only to samples taken after fasting for at least 8 hours.   BUN 22 8 - 23 mg/dL   Creatinine, Ser 0.99 0.44 - 1.00 mg/dL   Calcium 9.4 8.9 - 10.3 mg/dL   GFR, Estimated 59 (L) >60 mL/min    Comment: (NOTE) Calculated using the CKD-EPI Creatinine Equation (2021)  Anion gap 7 5 - 15    Comment: Performed at [facility], [place] [address], [place], [place] [address]   DG Tibia/Fibula Left  Result Date: 08/12/2021 CLINICAL DATA:  Dog bite to the left lower extremity with multiple  large wounds and tissue loss. EXAM: LEFT TIBIA AND FIBULA - 2 VIEW COMPARISON:  Left ankle 11/20/2019 FINDINGS: Soft tissue injury to the mid to distal left lower leg with soft tissue defect and soft tissue gas demonstrated posteriorly and medially. This is consistent with history of dog bite. No radiopaque soft tissue foreign bodies are identified. Bones appear intact without evidence of acute fracture or dislocation. IMPRESSION: Soft tissue injury to the posteromedial distal left lower leg with soft tissue gas present. No radiopaque foreign bodies. No acute bony abnormalities. Electronically Signed   By: Lucienne Capers M.D.   On: 08/12/2021 17:09    VITALS: BP 128/79   Pulse 72   Ht 5\' 7"  (1.702 m)   Wt 160 lb (72.6 kg)   LMP 03/23/1994 (Approximate)   SpO2 97%   BMI 25.06 kg/m   EXAM: General appearance: alert and no distress Neck: no adenopathy, no carotid bruit, no JVD, supple, symmetrical, trachea midline and thyroid not enlarged, symmetric, no tenderness/mass/nodules Lungs: clear to auscultation bilaterally Heart: regular rate and rhythm, S1, S2 normal and systolic murmur: early systolic 3/6, blowing at apex Abdomen: soft, non-tender; bowel sounds normal; no masses,  no organomegaly Extremities: extremities normal, atraumatic, no cyanosis or edema Pulses: 2+ and symmetric Skin: Skin color, texture, turgor normal. No rashes or lesions Neurologic: Grossly normal  EKG: Normal sinus rhythm at 72, nonspecific T wave changes-personally reviewed  ASSESSMENT: Remote pericardial effusion status post pericardial window - no reoccurence by echo in 10/2013 (EF 55-60%) Mild mitral regurgitation, trivial AI Hyperlipidemia Hypertension-controlled History of lung cancer  PLAN: 1.   Tina Owen had a recent dog bite.  She is also overdue for an echocardiogram.  I thought clear that she has had any bacteremia and certainly no constitutional symptoms but there is another reason there to  consider echo to make sure there is no evidence of any valvular infection.  She does have a loud murmur and I would recommend repeating her echocardiogram at this time.  Otherwise she is asymptomatic and can exercise without issues.  Blood pressure is well controlled.  She did have lipids in January with an LDL of 86 and total cholesterol 178.  Plan follow-up annually or sooner as necessary.  Pixie Casino, MD, [facility], [place] Director of the Advanced Lipid Disorders &  Cardiovascular Risk Reduction Clinic Diplomate of the American Board of Clinical Lipidology Attending Cardiologist  Direct Dial: [phone]  Fax: [phone]  Website:  [email or web address] 08/14/2021, 9:15 AM

## 2021-08-14 NOTE — Anesthesia Postprocedure Evaluation (Signed)
Anesthesia Post Note  Patient: Tina Owen  Procedure(s) Performed: IRRIGATION AND [place] OF LEFT LEG WOUNDS (Left: Leg Lower)     Patient location during evaluation: PACU Anesthesia Type: General Level of consciousness: awake and alert Pain management: pain level controlled Vital Signs Assessment: post-procedure vital signs reviewed and stable Respiratory status: spontaneous breathing, nonlabored ventilation, respiratory function stable and patient connected to nasal cannula oxygen Cardiovascular status: blood pressure returned to baseline and stable Postop Assessment: no apparent nausea or vomiting Anesthetic complications: no   No notable events documented.  Last Vitals:  Vitals:   08/12/21 2130 08/12/21 2146  BP:  111/70  Pulse: 75 72  Resp: 17 17  Temp:  36.6 C  SpO2: 99% 100%    Last Pain:  Vitals:   08/12/21 2146  TempSrc:   PainSc: 0-No pain                 Dene Nazir S

## 2021-08-27 ENCOUNTER — Ambulatory Visit (INDEPENDENT_AMBULATORY_CARE_PROVIDER_SITE_OTHER): Payer: Medicare PPO

## 2021-08-27 DIAGNOSIS — S81812D Laceration without foreign body, left lower leg, subsequent encounter: Secondary | ICD-10-CM | POA: Diagnosis not present

## 2021-08-27 DIAGNOSIS — W540XXD Bitten by dog, subsequent encounter: Secondary | ICD-10-CM | POA: Diagnosis not present

## 2021-08-27 DIAGNOSIS — R011 Cardiac murmur, unspecified: Secondary | ICD-10-CM

## 2021-08-27 LAB — ECHOCARDIOGRAM COMPLETE
AR max vel: 1.59 cm2
AV Area VTI: 1.48 cm2
AV Area mean vel: 1.44 cm2
AV Mean grad: 6 mmHg
AV Peak grad: 12 mmHg
AV Vena cont: 0.68 cm
Ao pk vel: 1.73 m/s
Area-P 1/2: 4.86 cm2
Calc EF: 57.1 %
MV M vel: 5.19 m/s
MV Peak grad: 107.7 mmHg
P 1/2 time: 342 msec
Radius: 0.6 cm
S' Lateral: 3.31 cm
Single Plane A2C EF: 56.2 %
Single Plane A4C EF: 58.3 %

## 2021-09-02 NOTE — Telephone Encounter (Signed)
I'm not sure she is symptomatic - I was more concerned with the murmur, but that appears only to be aortic sclerosis. No need to add diuretics.  Dr Lemmie Evens

## 2021-09-10 DIAGNOSIS — S81812D Laceration without foreign body, left lower leg, subsequent encounter: Secondary | ICD-10-CM | POA: Diagnosis not present

## 2021-09-10 DIAGNOSIS — W540XXD Bitten by dog, subsequent encounter: Secondary | ICD-10-CM | POA: Diagnosis not present

## 2021-10-04 NOTE — Op Note (Unsigned)
NAMEKHRISTA, Tina Owen MEDICAL RECORD NO: [id number] ACCOUNT NO: [id number] DATE OF BIRTH: December 21, 1945 FACILITY: [facility] LOCATION: [facility] PHYSICIAN: Astrid Divine. Ticia Virgo, MD  Operative Report   DATE OF PROCEDURE: 08/12/2021  PREOPERATIVE DIAGNOSIS:  Complex lacerations to the left leg greater than 15 cm through muscle fascia from dog bite.  POSTOPERATIVE DIAGNOSIS:  Complex lacerations to the left leg greater than 15 cm through muscle fascia from dog bite.  PROCEDURE: 1.  Debridement of left leg 40 square cm. 2.  Complex retention suture closure left leg greater than 15 cm.  SURGEON:  Astrid Divine. Marcelino Scot, MD  ASSISTANT:  Ainsley Spinner, PA-C  ANESTHESIA:  General.  COMPLICATIONS:  None.  TOURNIQUET:  None.  DISPOSITION:  To PACU.  CONDITION:  Stable.  BRIEF SUMMARY INDICATION FOR PROCEDURE:  The patient is a 76 year old female who sustained an unprovoked attack from a [place].  The patient had significant bleeding and complex lacerations in several areas of the left leg  with obvious muscle fascia disruption and visible muscle.  She did receive Unasyn in the emergency department.  I spoke with her and her husband regarding the risks and benefits of surgical debridement including the possibility of failure to achieve  closure, the need for serial procedures, loss of motion and others.  They acknowledged these risks and did wish to proceed.  BRIEF SUMMARY OF PROCEDURE:  The patient was taken to the operating room where general anesthesia was induced.  The left lower leg was prepped and draped in the usual sterile fashion  after aggressive chlorhexidine wash and preliminary irrigation  followed by Betadine scrub and paint.  A timeout was held after draping.  The wound edges were sharply debrided with a scalpel through the skin and subcutaneous tissue down to exposed and disrupted muscle fascia.  This too was debrided sharply with the  scalpel.  Some contaminated  muscle was removed with the Metzenbaum scissors.  This was at a depth of 2 cm.  The entire area was approximately 40 cm being several wide and again a little over 15 cm in length.  6000 mL of saline were used following this  debridement with application of fresh drapes and gloves. At that time it really appeared to have a very healthy bed with no visible contaminants and came together better than anticipated.  Consequently using a far-near-near-far retention suture  technique, we loosely reapproximated the wound over this 15 cm area and then applied a Mepitel and gauze dressing, followed by Ace wraps.  There were some loose Penrose quarter-inch placed as well to facilitate drainage from the deep tissues.  PROGNOSIS:  The patient will be weightbearing as tolerated in a CAM boot.  Return in the office this week for a dressing change and removal of the Penrose drains.   VAI D: 10/04/2021 11:49:17 am T: 10/04/2021 8:16:00 pm  JOB: [id number]/ [id number]

## 2021-10-22 DIAGNOSIS — W540XXD Bitten by dog, subsequent encounter: Secondary | ICD-10-CM | POA: Diagnosis not present

## 2021-10-22 DIAGNOSIS — S81812D Laceration without foreign body, left lower leg, subsequent encounter: Secondary | ICD-10-CM | POA: Diagnosis not present

## 2021-11-03 DIAGNOSIS — E039 Hypothyroidism, unspecified: Secondary | ICD-10-CM | POA: Diagnosis not present

## 2021-11-03 DIAGNOSIS — Z8249 Family history of ischemic heart disease and other diseases of the circulatory system: Secondary | ICD-10-CM | POA: Diagnosis not present

## 2021-11-03 DIAGNOSIS — Z7982 Long term (current) use of aspirin: Secondary | ICD-10-CM | POA: Diagnosis not present

## 2021-11-03 DIAGNOSIS — Z91048 Other nonmedicinal substance allergy status: Secondary | ICD-10-CM | POA: Diagnosis not present

## 2021-11-03 DIAGNOSIS — Z85118 Personal history of other malignant neoplasm of bronchus and lung: Secondary | ICD-10-CM | POA: Diagnosis not present

## 2021-11-03 DIAGNOSIS — Z809 Family history of malignant neoplasm, unspecified: Secondary | ICD-10-CM | POA: Diagnosis not present

## 2021-11-03 DIAGNOSIS — I10 Essential (primary) hypertension: Secondary | ICD-10-CM | POA: Diagnosis not present

## 2021-11-03 DIAGNOSIS — Z87891 Personal history of nicotine dependence: Secondary | ICD-10-CM | POA: Diagnosis not present

## 2021-11-03 DIAGNOSIS — E785 Hyperlipidemia, unspecified: Secondary | ICD-10-CM | POA: Diagnosis not present

## 2021-11-19 DIAGNOSIS — W540XXD Bitten by dog, subsequent encounter: Secondary | ICD-10-CM | POA: Diagnosis not present

## 2021-11-19 DIAGNOSIS — S81812D Laceration without foreign body, left lower leg, subsequent encounter: Secondary | ICD-10-CM | POA: Diagnosis not present

## 2021-11-26 ENCOUNTER — Encounter: Payer: Self-pay | Admitting: Internal Medicine

## 2021-12-30 ENCOUNTER — Encounter: Payer: Self-pay | Admitting: Internal Medicine

## 2022-01-12 ENCOUNTER — Encounter: Payer: Self-pay | Admitting: Internal Medicine

## 2022-02-21 DIAGNOSIS — R69 Illness, unspecified: Secondary | ICD-10-CM | POA: Diagnosis not present

## 2022-02-22 DIAGNOSIS — R69 Illness, unspecified: Secondary | ICD-10-CM | POA: Diagnosis not present

## 2022-02-23 DIAGNOSIS — R69 Illness, unspecified: Secondary | ICD-10-CM | POA: Diagnosis not present

## 2022-03-18 DIAGNOSIS — Z1231 Encounter for screening mammogram for malignant neoplasm of breast: Secondary | ICD-10-CM | POA: Diagnosis not present

## 2022-03-18 LAB — HM MAMMOGRAPHY

## 2022-04-07 ENCOUNTER — Encounter: Payer: Self-pay | Admitting: Family Medicine

## 2022-04-07 ENCOUNTER — Ambulatory Visit: Payer: Medicare PPO | Admitting: Family Medicine

## 2022-04-07 VITALS — BP 132/74 | HR 73 | Temp 98.0°F | Resp 20 | Ht 66.75 in | Wt 162.4 lb

## 2022-04-07 DIAGNOSIS — J301 Allergic rhinitis due to pollen: Secondary | ICD-10-CM | POA: Diagnosis not present

## 2022-04-07 DIAGNOSIS — E039 Hypothyroidism, unspecified: Secondary | ICD-10-CM

## 2022-04-07 DIAGNOSIS — Z0000 Encounter for general adult medical examination without abnormal findings: Secondary | ICD-10-CM | POA: Diagnosis not present

## 2022-04-07 DIAGNOSIS — I351 Nonrheumatic aortic (valve) insufficiency: Secondary | ICD-10-CM

## 2022-04-07 DIAGNOSIS — M8580 Other specified disorders of bone density and structure, unspecified site: Secondary | ICD-10-CM

## 2022-04-07 DIAGNOSIS — I340 Nonrheumatic mitral (valve) insufficiency: Secondary | ICD-10-CM | POA: Diagnosis not present

## 2022-04-07 DIAGNOSIS — Z8249 Family history of ischemic heart disease and other diseases of the circulatory system: Secondary | ICD-10-CM

## 2022-04-07 DIAGNOSIS — Z85118 Personal history of other malignant neoplasm of bronchus and lung: Secondary | ICD-10-CM

## 2022-04-07 DIAGNOSIS — E785 Hyperlipidemia, unspecified: Secondary | ICD-10-CM

## 2022-04-07 DIAGNOSIS — I10 Essential (primary) hypertension: Secondary | ICD-10-CM

## 2022-04-07 DIAGNOSIS — E782 Mixed hyperlipidemia: Secondary | ICD-10-CM

## 2022-04-07 MED ORDER — NEBIVOLOL HCL 10 MG PO TABS: ORAL_TABLET | ORAL | 3 refills | Status: DC

## 2022-04-07 MED ORDER — CHLORTHALIDONE 25 MG PO TABS: ORAL_TABLET | ORAL | 3 refills | Status: DC

## 2022-04-07 MED ORDER — ATORVASTATIN CALCIUM 80 MG PO TABS: ORAL_TABLET | ORAL | 3 refills | Status: DC

## 2022-04-07 MED ORDER — LEVOTHYROXINE SODIUM 100 MCG PO TABS: 100.0000 ug | ORAL_TABLET | Freq: Every day | ORAL | 3 refills | Status: DC

## 2022-04-07 MED ORDER — EZETIMIBE 10 MG PO TABS: ORAL_TABLET | ORAL | 3 refills | Status: DC

## 2022-04-07 NOTE — Progress Notes (Signed)
Tina Owen is a 77 y.o. female who presents for annual wellness visit and follow-up on chronic medical conditions.  She has no particular concerns or questions.  She follows up regularly with cardiology for her underlying valvular issues.  She continues on chlorthalidone and Bystolic.  She is also taking atorvastatin and Zetia.  Continues on her thyroid medication and is having no difficulty with that.  She is retired and she and her husband are doing a lot of traveling.  Her allergies are under good control.  She has a history of osteopenia and has been on multivitamins and calcium.  She does have remote history of lung cancer.  She also has a history of colonic polyps and is scheduled for follow-up on that.  Immunizations and Health Maintenance Immunization History  Administered Date(s) Administered   COVID-19, mRNA, vaccine(Comirnaty)12 years and older 12/16/2021   DTaP 01/01/1986   Influenza Split 02/05/2011, 01/19/2013   Influenza-Unspecified 12/18/2013, 12/21/2014, 12/24/2015, 01/25/2017, 01/04/2018, 12/03/2020, 12/16/2021   PFIZER Comirnaty(Gray Top)Covid-19 Tri-Sucrose Vaccine 04/23/2020, 07/27/2020   PFIZER(Purple Top)SARS-COV-2 Vaccination 04/17/2019, 05/08/2019, 01/09/2020   Pfizer Covid-19 Vaccine Bivalent Booster 65yrs & up 12/03/2020   Pneumococcal Conjugate-13 03/11/2017   Pneumococcal Polysaccharide-23 12/11/2005, 02/08/2012   Tdap 02/03/2008, 02/05/2011, 11/20/2019   Zoster Recombinat (Shingrix) 07/13/2016, 10/25/2016   Zoster, Live 05/11/2006   There are no preventive care reminders to display for this patient.   Last Pap smear: Last mammogram: 03/18/2022 Last colonoscopy: 05/09/2018 Last DEXA: 03/24/2018 Dentist: within the last year Ophtho: within the last year Exercise: walking her dog 4 times a week  Other doctors caring for patient include: Hilty, Lisette Abu, MD Cardiology    Advanced directives: Yes. On file.     Depression screen:  See questionnaire  below.     04/07/2022    9:41 AM 03/31/2021    9:31 AM 03/21/2020    9:35 AM 03/20/2019    9:41 AM 03/18/2018    8:36 AM  Depression screen PHQ 2/9  Decreased Interest 0 0 0 0 0  Down, Depressed, Hopeless 0 0 0 0 0  PHQ - 2 Score 0 0 0 0 0    Fall Risk Screen: see questionnaire below.    04/07/2022    9:41 AM 03/31/2021    9:31 AM 03/21/2020    9:34 AM 03/20/2019    9:40 AM 03/18/2018    8:36 AM  Fall Risk   Falls in the past year? 0 0 1 0 0  Number falls in past yr: 0 0 1    Injury with Fall? 0 0 1    Risk for fall due to : No Fall Risks No Fall Risks     Follow up Falls evaluation completed Falls evaluation completed       ADL screen:  See questionnaire below Functional Status Survey: Is the patient deaf or have difficulty hearing?: No Does the patient have difficulty seeing, even when wearing glasses/contacts?: No Does the patient have difficulty concentrating, remembering, or making decisions?: No Does the patient have difficulty walking or climbing stairs?: No Does the patient have difficulty dressing or bathing?: No Does the patient have difficulty doing errands alone such as visiting a doctor's office or shopping?: No   Review of Systems Constitutional: -, -unexpected weight change, -anorexia, -fatigue Allergy: -sneezing, -itching, -congestion Dermatology: denies changing moles, rash, lumps ENT: -runny nose, -ear pain, -sore throat,  Cardiology:  -chest pain, -palpitations, -orthopnea, Respiratory: -cough, -shortness of breath, -dyspnea on exertion, -wheezing,  Gastroenterology: -abdominal  pain, -nausea, -vomiting, -diarrhea, -constipation, -dysphagia Hematology: -bleeding or bruising problems Musculoskeletal: -arthralgias, -myalgias, -joint swelling, -back pain, - Ophthalmology: -vision changes,  Urology: -dysuria, -difficulty urinating,  -urinary frequency, -urgency, incontinence Neurology: -, -numbness, , -memory loss, -falls, -dizziness    PHYSICAL  EXAM:  BP 132/74   Pulse 73   Temp 98 F (36.7 C) (Oral)   Resp 20   Ht 5' 6.75" (1.695 m)   Wt 162 lb 6.4 oz (73.7 kg)   LMP 03/23/1994 (Approximate)   SpO2 97% Comment: room air  BMI 25.63 kg/m   General Appearance: Alert, cooperative, no distress, appears stated age Head: Normocephalic, without obvious abnormality, atraumatic Eyes: PERRL, conjunctiva/corneas clear, EOM's intact,  Nose: Nares normal, mucosa normal, no drainage or sinus tenderness Throat: Lips, mucosa, and tongue normal; teeth and gums normal Neck: Supple, no lymphadenopathy;  thyroid:  no enlargement/tenderness/nodules; no carotid bruit or JVD Lungs: Clear to auscultation bilaterally without wheezes, rales or ronchi; respirations unlabored Heart: Regular rate and rhythm, S1 and S2 normal, midsystolic murmur heard. Abdomen: Soft, non-tender, nondistended, normoactive bowel sounds,  no masses, no hepatosplenomegaly Extremities: No clubbing, cyanosis or edema Pulses: 2+ and symmetric all extremities Skin:  Skin color, texture, turgor normal, no rashes or lesions Lymph nodes: Cervical, supraclavicular, and axillary nodes normal Neurologic:  CNII-XII intact, normal strength, sensation and gait; reflexes 2+ and symmetric throughout Psych: Normal mood, affect, hygiene and grooming.  ASSESSMENT/PLAN: Routine general medical examination at a health care facility - Plan: CBC with Differential/Platelet, Comprehensive metabolic panel, Lipid panel  Essential hypertension - Plan: CBC with Differential/Platelet, Comprehensive metabolic panel, nebivolol (BYSTOLIC) 10 MG tablet, chlorthalidone (HYGROTON) 25 MG tablet  Mitral valve insufficiency, unspecified etiology  Nonrheumatic aortic valve insufficiency  Allergic rhinitis due to pollen, unspecified seasonality  Hypothyroidism, unspecified type - Plan: TSH, levothyroxine (SYNTHROID) 100 MCG tablet  Osteopenia, unspecified location - Plan: DG Bone Density  Family  history of heart disease in female family member before age 54  History of lung cancer  Hyperlipidemia with target LDL less than 100 - Plan: Lipid panel, atorvastatin (LIPITOR) 80 MG tablet  Mixed hyperlipidemia - Plan: ezetimibe (ZETIA) 10 MG tablet    Discussed goals of calcium and vitamin D intake and alcohol recommendations (less than or equal to 1 drink/day) reviewed; Marland Kitchen  Immunization recommendations discussed.  Colonoscopy recommendations reviewed   Medicare Attestation I have personally reviewed: The patient's medical and social history Their use of alcohol, tobacco or illicit drugs Their current medications and supplements The patient's functional ability including ADLs,fall risks, home safety risks, cognitive, and hearing and visual impairment Diet and physical activities Evidence for depression or mood disorders  The patient's weight, height, and BMI have been recorded in the chart.  I have made referrals, counseling, and provided education to the patient based on review of the above and I have provided the patient with a written personalized care plan for preventive services.     Sharlot Gowda, MD   04/07/2022

## 2022-04-07 NOTE — Patient Instructions (Signed)
  Tina Owen , Thank you for taking time to come for your Medicare Wellness Visit. I appreciate your ongoing commitment to your health goals. Please review the following plan we discussed and let me know if I can assist you in the future.   These are the goals we discussed:  Goals   None     This is a list of the screening recommended for you and due dates:  Health Maintenance  Topic Date Due   Medicare Annual Wellness Visit  04/08/2023   Colon Cancer Screening  05/10/2023   DTaP/Tdap/Td vaccine (5 - Td or Tdap) 11/19/2029   Pneumonia Vaccine  Completed   Flu Shot  Completed   DEXA scan (bone density measurement)  Completed   COVID-19 Vaccine  Completed   Hepatitis C Screening: USPSTF Recommendation to screen - Ages 37-79 yo.  Completed   Zoster (Shingles) Vaccine  Completed   HPV Vaccine  Aged Out

## 2022-04-08 LAB — CBC WITH DIFFERENTIAL/PLATELET
Basophils Absolute: 0 10*3/uL (ref 0.0–0.2)
Basos: 1 %
EOS (ABSOLUTE): 0.1 10*3/uL (ref 0.0–0.4)
Eos: 2 %
Hematocrit: 40.3 % (ref 34.0–46.6)
Hemoglobin: 14.2 g/dL (ref 11.1–15.9)
Immature Grans (Abs): 0 10*3/uL (ref 0.0–0.1)
Immature Granulocytes: 1 %
Lymphocytes Absolute: 1.5 10*3/uL (ref 0.7–3.1)
Lymphs: 23 %
MCH: 31.1 pg (ref 26.6–33.0)
MCHC: 35.2 g/dL (ref 31.5–35.7)
MCV: 88 fL (ref 79–97)
Monocytes Absolute: 0.7 10*3/uL (ref 0.1–0.9)
Monocytes: 10 %
Neutrophils Absolute: 4.3 10*3/uL (ref 1.4–7.0)
Neutrophils: 63 %
Platelets: 200 10*3/uL (ref 150–450)
RBC: 4.56 x10E6/uL (ref 3.77–5.28)
RDW: 12.1 % (ref 11.7–15.4)
WBC: 6.6 10*3/uL (ref 3.4–10.8)

## 2022-04-08 LAB — LIPID PANEL
Chol/HDL Ratio: 2.4 ratio (ref 0.0–4.4)
Cholesterol, Total: 145 mg/dL (ref 100–199)
HDL: 61 mg/dL (ref >39)
LDL Chol Calc (NIH): 68 mg/dL (ref 0–99)
Triglycerides: 87 mg/dL (ref 0–149)
VLDL Cholesterol Cal: 16 mg/dL (ref 5–40)

## 2022-04-08 LAB — COMPREHENSIVE METABOLIC PANEL
ALT: 24 IU/L (ref 0–32)
AST: 23 IU/L (ref 0–40)
Albumin/Globulin Ratio: 1.8 (ref 1.2–2.2)
Albumin: 4.1 g/dL (ref 3.8–4.8)
Alkaline Phosphatase: 76 IU/L (ref 44–121)
BUN/Creatinine Ratio: 20 (ref 12–28)
BUN: 18 mg/dL (ref 8–27)
Bilirubin Total: 0.9 mg/dL (ref 0.0–1.2)
CO2: 26 mmol/L (ref 20–29)
Calcium: 10.1 mg/dL (ref 8.7–10.3)
Chloride: 103 mmol/L (ref 96–106)
Creatinine, Ser: 0.92 mg/dL (ref 0.57–1.00)
Globulin, Total: 2.3 g/dL (ref 1.5–4.5)
Glucose: 100 mg/dL — ABNORMAL HIGH (ref 70–99)
Potassium: 4.3 mmol/L (ref 3.5–5.2)
Sodium: 142 mmol/L (ref 134–144)
Total Protein: 6.4 g/dL (ref 6.0–8.5)
eGFR: 64 mL/min/{1.73_m2} (ref >59)

## 2022-04-08 LAB — TSH: TSH: 1.83 u[IU]/mL (ref 0.450–4.500)

## 2022-05-26 ENCOUNTER — Other Ambulatory Visit: Payer: Medicare PPO

## 2022-07-22 ENCOUNTER — Telehealth: Payer: Self-pay | Admitting: Family Medicine

## 2022-07-22 NOTE — Telephone Encounter (Signed)
Latina called and says she had an appointment to get her mammo done at [facility] imaging but they advise her to go back to [place] since she has got her last 3 mammos there. Will she need a referral put in for this?

## 2022-07-22 NOTE — Telephone Encounter (Signed)
Pt called back and states that she spoke to [facility] and they do require pt orders. Please send order to [facility],

## 2022-07-23 ENCOUNTER — Inpatient Hospital Stay: Admission: RE | Admit: 2022-07-23 | Payer: Medicare PPO | Source: Ambulatory Visit

## 2022-07-27 NOTE — Telephone Encounter (Signed)
Order faxed to [facility]. Patient advised.

## 2022-08-19 ENCOUNTER — Ambulatory Visit: Payer: Medicare PPO | Admitting: Physician Assistant

## 2022-08-19 ENCOUNTER — Ambulatory Visit: Payer: Medicare PPO | Admitting: Nurse Practitioner

## 2022-08-26 DIAGNOSIS — M8588 Other specified disorders of bone density and structure, other site: Secondary | ICD-10-CM | POA: Diagnosis not present

## 2022-08-26 LAB — HM DEXA SCAN

## 2022-08-27 NOTE — Progress Notes (Signed)
Office Visit    Patient Name: Tina Owen Date of Encounter: 08/27/2022  Primary Care Provider:  Ronnald Nian, MD Primary Cardiologist:  Chrystie Nose, MD  Chief Complaint    77 year old female with past medical history of lung cancer status post radiation and chemotherapy, hypothyroidism, mild MV regurgitation, pericardial effusion with pericardial window in 2010, dyslipidemia, aortic valve sclerosis and hypertension.   Past Medical History    Past Medical History:  Diagnosis Date   Allergy    SEASONAL   Bilateral hip pain    Blood transfusion without reported diagnosis    as an infant. 84 week old, 1947   [facility])    LUNG, 1998 - surgery, chemo, radiation   Cataract    Colonic polyp 2004   Dyslipidemia    Dyspnea    FHx: cardiovascular disease    Fibroid    Heart murmur    Hyperlipidemia    Hypertension    Hypothyroid    Osteopenia    Past Surgical History:  Procedure Laterality Date   BREAST CYST EXCISION Left    age 75-14   CATARACT EXTRACTION, BILATERAL  08/2016   CESAREAN SECTION  4098,[id number]   x2   CHOLECYSTECTOMY  1998   COLONOSCOPY     INCISION AND DRAINAGE OF WOUND Left 08/12/2021   Procedure: IRRIGATION AND DEBRIDEMENT Myles Rosenthal OF LEFT LEG WOUNDS;  Surgeon: Myrene Galas, MD;  Location: [facility];  Service: Orthopedics;  Laterality: Left;   LAPAROSCOPIC BILATERAL SALPINGO OOPHERECTOMY N/A 09/29/2016   Procedure: LAPAROSCOPIC BILATERAL SALPINGO OOPHORECTOMY WITH WASHINGS;  Surgeon: Jerene Bears, MD;  Location: [facility];  Service: Gynecology;  Laterality: N/A;   LUNG CANCER SURGERY Left 1998   L upper lobectomy     PERICARDIAL WINDOW  2010   Dr Edwyna Shell   TRANSTHORACIC ECHOCARDIOGRAM  10/2010   EF>55%; LA mild-mod dilated; borderline RA englargement; mild MR, trace TR, mild AV regurg, trace PV regurg    TUBAL LIGATION  1990    Allergies  Allergies  Allergen Reactions   Tape Itching    Redness and scars  PAPER TAPE IS OKAY      Labs/Other Studies Reviewed    The following studies were reviewed today: Cardiac Studies & Procedures       ECHOCARDIOGRAM  ECHOCARDIOGRAM COMPLETE 08/27/2021  Narrative ECHOCARDIOGRAM REPORT    Patient Name:   Tina Owen Date of Exam: 08/27/2021 Medical Rec #:  [id number]       Height:       67.0 in Accession #:    [id number]      Weight:       160.0 lb Date of Birth:  [birth year]       BSA:          1.839 m Patient Age:    76 years        BP:           130/80 mmHg Patient Gender: F               HR:           72 bpm. Exam Location:  Outpatient  Procedure: 2D Echo, Cardiac Doppler, Color Doppler and Strain Analysis  Indications:    R06.9 DOE; R01.1 Murmur  History:        Patient has prior history of Echocardiogram examinations, most recent 03/16/2019. Signs/Symptoms:Dyspnea and Murmur; Risk Factors:Hypertension, Dyslipidemia and Former Smoker. Patient denies chest pain and leg edema. She does  have some DOE due to partial lung removal due to lung cancer 20+ years ago. History of radiation and chemotherapy.  Sonographer:    Carlos American RVT, RDCS (AE), RDMS Referring Phys: [id number] KENNETH C HILTY  IMPRESSIONS   1. Left ventricular ejection fraction, by estimation, is 55 to 60%. The left ventricle has normal function. The left ventricle has no regional wall motion abnormalities. Left ventricular diastolic parameters are consistent with Grade II diastolic dysfunction (pseudonormalization). Elevated left atrial pressure. The average left ventricular global longitudinal strain is -12.3 %. 2. Right ventricular systolic function is normal. The right ventricular size is normal. There is mildly elevated pulmonary artery systolic pressure. The estimated right ventricular systolic pressure is 36.4 mmHg. 3. Left atrial size was mildly dilated. 4. Right atrial size was mildly dilated. 5. The mitral valve is normal in structure. Mild mitral valve regurgitation. No evidence of  mitral stenosis. 6. The aortic valve is tricuspid. There is mild calcification of the aortic valve. There is moderate thickening of the aortic valve. Aortic valve regurgitation is mild to moderate. Aortic valve sclerosis/calcification is present, without any evidence of aortic stenosis. 7. The inferior vena cava is normal in size with greater than 50% respiratory variability, suggesting right atrial pressure of 3 mmHg.  Comparison(s): Prior images reviewed side by side. The left ventricular diastolic function is significantly worse.  FINDINGS Left Ventricle: Left ventricular ejection fraction, by estimation, is 55 to 60%. The left ventricle has normal function. The left ventricle has no regional wall motion abnormalities. The average left ventricular global longitudinal strain is -12.3 %. The left ventricular internal cavity size was normal in size. There is no left ventricular hypertrophy. Left ventricular diastolic parameters are consistent with Grade II diastolic dysfunction (pseudonormalization). Elevated left atrial pressure.  Right Ventricle: The right ventricular size is normal. No increase in right ventricular wall thickness. Right ventricular systolic function is normal. There is mildly elevated pulmonary artery systolic pressure. The tricuspid regurgitant velocity is 2.89 m/s, and with an assumed right atrial pressure of 3 mmHg, the estimated right ventricular systolic pressure is 36.4 mmHg.  Left Atrium: Left atrial size was mildly dilated.  Right Atrium: Right atrial size was mildly dilated.  Pericardium: There is no evidence of pericardial effusion.  Mitral Valve: The mitral valve is normal in structure. Mild mitral annular calcification. Mild mitral valve regurgitation, with centrally-directed jet. No evidence of mitral valve stenosis.  Tricuspid Valve: The tricuspid valve is normal in structure. Tricuspid valve regurgitation is trivial.  Aortic Valve: The aortic valve is  tricuspid. There is mild calcification of the aortic valve. There is moderate thickening of the aortic valve. Aortic valve regurgitation is mild to moderate. Aortic regurgitation PHT measures 342 msec. Aortic valve sclerosis/calcification is present, without any evidence of aortic stenosis. Aortic valve mean gradient measures 6.0 mmHg. Aortic valve peak gradient measures 12.0 mmHg. Aortic valve area, by VTI measures 1.48 cm.  Pulmonic Valve: The pulmonic valve was normal in structure. Pulmonic valve regurgitation is mild.  Aorta: The aortic root and ascending aorta are structurally normal, with no evidence of dilitation.  Venous: The inferior vena cava is normal in size with greater than 50% respiratory variability, suggesting right atrial pressure of 3 mmHg.  IAS/Shunts: No atrial level shunt detected by color flow Doppler.   LEFT VENTRICLE PLAX 2D LVIDd:         4.65 cm     Diastology LVIDs:         3.31 cm  LV e' medial:    5.08 cm/s LV PW:         0.93 cm     LV E/e' medial:  24.6 LV IVS:        0.94 cm     LV e' lateral:   8.38 cm/s LVOT diam:     2.10 cm     LV E/e' lateral: 14.9 LV SV:         61 LV SV Index:   33          2D Longitudinal Strain LVOT Area:     3.46 cm    2D Strain GLS Avg:     -12.3 %  LV Volumes (MOD) LV vol d, MOD A2C: 86.7 ml LV vol d, MOD A4C: 71.7 ml LV vol s, MOD A2C: 38.0 ml LV vol s, MOD A4C: 29.9 ml LV SV MOD A2C:     48.7 ml LV SV MOD A4C:     71.7 ml LV SV MOD BP:      46.2 ml  RIGHT VENTRICLE RV S prime:     14.40 cm/s TAPSE (M-mode): 2.1 cm  LEFT ATRIUM             Index        RIGHT ATRIUM           Index LA diam:        4.00 cm 2.17 cm/m   RA Area:     19.20 cm LA Vol (A2C):   76.8 ml 41.76 ml/m  RA Volume:   58.30 ml  31.70 ml/m LA Vol (A4C):   70.0 ml 38.06 ml/m LA Biplane Vol: 74.2 ml 40.34 ml/m AORTIC VALVE                     PULMONIC VALVE AV Area (Vmax):    1.59 cm      PV Vmax:          1.77 m/s AV Area (Vmean):    1.44 cm      PV Vmean:         118.000 cm/s AV Area (VTI):     1.48 cm      PV VTI:           0.414 m AV Vmax:           173.00 cm/s   PV Peak grad:     12.5 mmHg AV Vmean:          118.000 cm/s  PV Mean grad:     7.0 mmHg AV VTI:            0.412 m       PR End Diast Vel: 4.33 msec AV Peak Grad:      12.0 mmHg AV Mean Grad:      6.0 mmHg LVOT Vmax:         79.50 cm/s LVOT Vmean:        49.100 cm/s LVOT VTI:          0.176 m LVOT/AV VTI ratio: 0.43 AI PHT:            342 msec AR Vena Contracta: 0.68 cm  AORTA Ao Root diam: 3.40 cm Ao Asc diam:  3.50 cm Ao Arch diam: 2.7 cm  MITRAL VALVE                  TRICUSPID VALVE MV Area (PHT): 4.86 cm       TR  Peak grad:   33.4 mmHg MV Decel Time: 156 msec       TR Vmax:        289.00 cm/s MR Peak grad:    107.7 mmHg MR Mean grad:    79.0 mmHg    SHUNTS MR Vmax:         519.00 cm/s  Systemic VTI:  0.18 m MR Vmean:        422.0 cm/s   Systemic Diam: 2.10 cm MR PISA:         2.26 cm MR PISA Eff ROA: 10 mm MR PISA Radius:  0.60 cm MV E velocity: 125.00 cm/s MV A velocity: 52.80 cm/s MV E/A ratio:  2.37  Mihai Croitoru MD Electronically signed by Thurmon Fair MD Signature Date/Time: 08/27/2021/3:25:42 PM    Final            Recent Labs: 04/07/2022: ALT 24; BUN 18; Creatinine, Ser 0.92; Hemoglobin 14.2; Platelets 200; Potassium 4.3; Sodium 142; TSH 1.830  Recent Lipid Panel    Component Value Date/Time   CHOL 145 04/07/2022 1030   TRIG 87 04/07/2022 1030   HDL 61 04/07/2022 1030   CHOLHDL 2.4 04/07/2022 1030   CHOLHDL 3.7 03/11/2017 0947   VLDL 31 (H) 02/26/2016 0956   LDLCALC 68 04/07/2022 1030   LDLCALC 128 (H) 03/11/2017 0947   History of Present Illness    Ms. Kelch is a 77yo woman with past medical history noted above including hypertension, hyperlipidemia, aortic valve sclerosis, mitral valve regurgitation, lung cancer, cardiac murmur and pericardial effusion with subsequent pericardial window in 2010. Her last  echo in 08/2021 showed EF 55 to 60%, left ventricle grade II diastolic dysfunction, mild MV regurgitation, mild aortic valve sclerosis. She was last seen 5/23 for annual follow up with Dr. Rennis Golden, she was stable from a cardiac standpoint.   Today she reports she is doing well overall. Notes she is tired as she went on a RV trip to [place] last month then moved her son back home from [place]. She has noted in the last year some bilateral lower extremity fatigue and aching after walking longer distances, relieved with rest. Feels her breathing is at baseline, will have DOE after climbing two flights of stairs or when walking up a hill, otherwise has no shortness of breath. She denies chest pain, palpitations, pnd, orthopnea, dizziness, syncope, or weight gain. Has had some left lower extremity swelling, associated with dog bite from last year, but has had no bilateral lower extremity edema.   Home Medications    Current Outpatient Medications  Medication Sig Dispense Refill   aspirin EC 81 MG tablet Take 81 mg by mouth daily.     atorvastatin (LIPITOR) 80 MG tablet TAKE 1 TABLET(80 MG) BY MOUTH DAILY 90 tablet 3   Calcium Carbonate-Vitamin D (CALCIUM PLUS VITAMIN D PO) Take 1 tablet by mouth at bedtime.     chlorthalidone (HYGROTON) 25 MG tablet TAKE 1/2 TABLET(12.5 MG) BY MOUTH DAILY 45 tablet 3   ezetimibe (ZETIA) 10 MG tablet TAKE 1 TABLET(10 MG) BY MOUTH DAILY 90 tablet 3   levothyroxine (SYNTHROID) 100 MCG tablet Take 1 tablet (100 mcg total) by mouth daily before breakfast. 90 tablet 3   Multiple Vitamin (MULTIVITAMIN) tablet Take 1 tablet by mouth daily.     nebivolol (BYSTOLIC) 10 MG tablet TAKE 1 ZOXWRU(04 MG) BY MOUTH DAILY 90 tablet 3   No current facility-administered medications for this visit.     Review of Systems  All other systems reviewed and are otherwise negative except as noted above.   Physical Exam    VS:  BP 138/74 (BP Location: Left Arm, Patient Position: Sitting, Cuff  Size: Normal)   Pulse 74   Ht 5' 6.5" (1.689 m)   Wt 155 lb 3.2 oz (70.4 kg)   LMP 03/23/1994 (Approximate)   SpO2 98%   BMI 24.67 kg/m  , BMI Body mass index is 24.67 kg/m.     GEN: Well nourished, well developed, in no acute distress. HEENT: normal. Neck: Supple, no JVD, carotid bruits, or masses. Cardiac: RRR, 3/6 systolic murmur. No clubbing, cyanosis, edema.  Radials/DP/PT 2+ and equal bilaterally.  Respiratory:  Respirations regular and unlabored, clear to auscultation bilaterally. GI: Soft, nontender, nondistended, BS + x 4. MS: no deformity or atrophy. Skin: warm and dry, no rash. Scar from dog bite left calf Neuro:  Strength and sensation are intact. Psych: Normal affect.  Accessory Clinical Findings    ECG personally reviewed by me today - Normal sinus rhythm at 74bpm, with possible incomplete bundle branch block    Lab Results  Component Value Date   WBC 6.6 04/07/2022   HGB 14.2 04/07/2022   HCT 40.3 04/07/2022   MCV 88 04/07/2022   PLT 200 04/07/2022   Lab Results  Component Value Date   CREATININE 0.92 04/07/2022   BUN 18 04/07/2022   NA 142 04/07/2022   K 4.3 04/07/2022   CL 103 04/07/2022   CO2 26 04/07/2022   Lab Results  Component Value Date   ALT 24 04/07/2022   AST 23 04/07/2022   ALKPHOS 76 04/07/2022   BILITOT 0.9 04/07/2022   Lab Results  Component Value Date   CHOL 145 04/07/2022   HDL 61 04/07/2022   LDLCALC 68 04/07/2022   TRIG 87 04/07/2022   CHOLHDL 2.4 04/07/2022    Lab Results  Component Value Date   HGBA1C 5.6 03/20/2019    Assessment & Plan    Mitral valve regurgitation/Aortic valve sclerosis/Cardiac Murmur: Last echocardiogram 08/27/21 indicated mild mitral valve regurgitation and aortic valve sclerosis with mild to moderate regurgitation, her EF was 55 to 60%, left ventricle grade II diastolic dysfunction noted. Notes mild dyspnea with significant physical exertion, otherwise euvolemic and well compensated on exam.  Consider repeat echo in 2-3 years or sooner if clinically indicated.   Hypertension: Blood pressure well controlled, 138/74 today. Continue chlorthalidone, and nebivolol.   Hyperlipidemia: Last lipid panel in January cholesterol 145, HDL 61, triglycerides 87, LDL 68. She is at goal of less than 70. Continue atorvastatin and ezetimibe.   Lower extremity muscle fatigue: She reports her bilateral lower extremities are fatiguing faster with longer walks that can result in aching, relieved with rest, starting in the last year. Her DP/PT pulses are +2 bilaterally. She would like to continue monitoring for now, will alert the office if worsening or changing. If symptoms persist or worsen can consider ABIs.   Follow up in one year or earlier if needed.     Rip Harbour, NP 08/28/2022, 3:24 PM

## 2022-08-28 ENCOUNTER — Encounter: Payer: Self-pay | Admitting: Nurse Practitioner

## 2022-08-28 ENCOUNTER — Ambulatory Visit: Payer: Medicare PPO | Attending: Physician Assistant | Admitting: Cardiology

## 2022-08-28 VITALS — BP 138/74 | HR 74 | Ht 66.5 in | Wt 155.2 lb

## 2022-08-28 DIAGNOSIS — I10 Essential (primary) hypertension: Secondary | ICD-10-CM | POA: Diagnosis not present

## 2022-08-28 DIAGNOSIS — R011 Cardiac murmur, unspecified: Secondary | ICD-10-CM

## 2022-08-28 DIAGNOSIS — E785 Hyperlipidemia, unspecified: Secondary | ICD-10-CM

## 2022-08-28 DIAGNOSIS — I351 Nonrheumatic aortic (valve) insufficiency: Secondary | ICD-10-CM

## 2022-08-28 DIAGNOSIS — I340 Nonrheumatic mitral (valve) insufficiency: Secondary | ICD-10-CM | POA: Diagnosis not present

## 2022-08-28 NOTE — Patient Instructions (Signed)
Medication Instructions:  Your physician recommends that you continue on your current medications as directed. Please refer to the Current Medication list given to you today.   *If you need a refill on your cardiac medications before your next appointment, please call your pharmacy*   Lab Work: NONE ordered at this time of appointment   Testing/Procedures: NONE ordered at this time of appointment   Follow-Up: At [facility], you and your health needs are our priority.  As part of our continuing mission to provide you with exceptional heart care, we have created designated Provider Care Teams.  These Care Teams include your primary Cardiologist (physician) and Advanced Practice Providers (APPs -  Physician Assistants and Nurse Practitioners) who all work together to provide you with the care you need, when you need it.  We recommend signing up for the patient portal called "MyChart".  Sign up information is provided on this After Visit Summary.  MyChart is used to connect with patients for Virtual Visits (Telemedicine).  Patients are able to view lab/test results, encounter notes, upcoming appointments, etc.  Non-urgent messages can be sent to your provider as well.   To learn more about what you can do with MyChart, go to [email or web address].    Your next appointment:   1 year(s)  Provider:   Kenneth C Hilty, MD     Other Instructions  

## 2022-10-08 ENCOUNTER — Encounter: Payer: Self-pay | Admitting: Family Medicine

## 2023-02-21 ENCOUNTER — Other Ambulatory Visit: Payer: Self-pay | Admitting: Family Medicine

## 2023-02-21 DIAGNOSIS — E039 Hypothyroidism, unspecified: Secondary | ICD-10-CM

## 2023-03-10 ENCOUNTER — Telehealth: Payer: Self-pay | Admitting: Family Medicine

## 2023-03-10 NOTE — Telephone Encounter (Signed)
New pneumonia vaccine per pharmacist and she wants to know if she should get it   Please call

## 2023-03-22 DIAGNOSIS — Z1231 Encounter for screening mammogram for malignant neoplasm of breast: Secondary | ICD-10-CM | POA: Diagnosis not present

## 2023-03-22 LAB — HM MAMMOGRAPHY

## 2023-03-23 ENCOUNTER — Encounter: Payer: Self-pay | Admitting: Family Medicine

## 2023-03-26 ENCOUNTER — Telehealth: Payer: Self-pay | Admitting: Family Medicine

## 2023-03-26 NOTE — Telephone Encounter (Signed)
 Raizy called to inform us that she got the new pnuemonia shot on 03/23/23 at [facility].

## 2023-04-20 ENCOUNTER — Encounter: Payer: Self-pay | Admitting: Family Medicine

## 2023-04-20 ENCOUNTER — Ambulatory Visit: Payer: Medicare PPO | Admitting: Family Medicine

## 2023-04-20 VITALS — BP 160/80 | HR 68 | Ht 67.0 in | Wt 159.2 lb

## 2023-04-20 DIAGNOSIS — M8580 Other specified disorders of bone density and structure, unspecified site: Secondary | ICD-10-CM | POA: Diagnosis not present

## 2023-04-20 DIAGNOSIS — I10 Essential (primary) hypertension: Secondary | ICD-10-CM | POA: Diagnosis not present

## 2023-04-20 DIAGNOSIS — Z0000 Encounter for general adult medical examination without abnormal findings: Secondary | ICD-10-CM | POA: Diagnosis not present

## 2023-04-20 DIAGNOSIS — E038 Other specified hypothyroidism: Secondary | ICD-10-CM

## 2023-04-20 DIAGNOSIS — E785 Hyperlipidemia, unspecified: Secondary | ICD-10-CM

## 2023-04-20 DIAGNOSIS — J301 Allergic rhinitis due to pollen: Secondary | ICD-10-CM

## 2023-04-20 DIAGNOSIS — I351 Nonrheumatic aortic (valve) insufficiency: Secondary | ICD-10-CM

## 2023-04-20 DIAGNOSIS — Z85118 Personal history of other malignant neoplasm of bronchus and lung: Secondary | ICD-10-CM

## 2023-04-20 DIAGNOSIS — E039 Hypothyroidism, unspecified: Secondary | ICD-10-CM

## 2023-04-20 DIAGNOSIS — E782 Mixed hyperlipidemia: Secondary | ICD-10-CM | POA: Diagnosis not present

## 2023-04-20 DIAGNOSIS — Z860100 Personal history of colon polyps, unspecified: Secondary | ICD-10-CM

## 2023-04-20 DIAGNOSIS — I340 Nonrheumatic mitral (valve) insufficiency: Secondary | ICD-10-CM | POA: Diagnosis not present

## 2023-04-20 LAB — LIPID PANEL

## 2023-04-20 MED ORDER — ATORVASTATIN CALCIUM 80 MG PO TABS: ORAL_TABLET | ORAL | 3 refills | Status: AC

## 2023-04-20 MED ORDER — LEVOTHYROXINE SODIUM 100 MCG PO TABS: 100.0000 ug | ORAL_TABLET | Freq: Every day | ORAL | 3 refills | Status: DC

## 2023-04-20 MED ORDER — CHLORTHALIDONE 25 MG PO TABS: ORAL_TABLET | ORAL | 3 refills | Status: AC

## 2023-04-20 MED ORDER — NEBIVOLOL HCL 10 MG PO TABS: ORAL_TABLET | ORAL | 3 refills | Status: AC

## 2023-04-20 MED ORDER — EZETIMIBE 10 MG PO TABS: ORAL_TABLET | ORAL | 3 refills | Status: AC

## 2023-04-20 NOTE — Progress Notes (Signed)
Tina Owen is a 78 y.o. female who presents for annual wellness visit and follow-up on chronic medical conditions.  She has noted some slight difficulty with legs being tired but did not mention the fact that she went out for a 19,000 steps on a recent trip and had no difficulty with that.  She does complain of some slight arthritis in her hands.  Her allergies seem to be under good control.  She continues on chlorthalidone as well as Bystolic and having no difficulty with that.  She is also taking the Lipitor and Zetia.  Continues on Synthroid without difficulty.  She has a remote history of lung cancer. Immunizations and Health Maintenance Immunization History  Administered Date(s) Administered   DTaP 01/01/1986   Influenza Split 02/05/2011, 01/19/2013   Influenza-Unspecified 12/18/2013, 12/21/2014, 12/24/2015, 01/25/2017, 01/04/2018, 12/03/2020, 12/16/2021, 12/31/2022   PFIZER Comirnaty(Gray Top)Covid-19 Tri-Sucrose Vaccine 04/23/2020, 07/27/2020   PFIZER(Purple Top)SARS-COV-2 Vaccination 04/17/2019, 05/08/2019, 01/09/2020   PNEUMOCOCCAL CONJUGATE-20 03/23/2023   Pfizer Covid-19 Vaccine Bivalent Booster 18yrs & up 12/03/2020   Pfizer(Comirnaty)Fall Seasonal Vaccine 12 years and older 12/16/2021, 12/31/2022   Pneumococcal Conjugate-13 03/11/2017   Pneumococcal Polysaccharide-23 12/11/2005, 02/08/2012   Tdap 02/03/2008, 02/05/2011, 11/20/2019   Zoster Recombinant(Shingrix) 07/13/2016, 10/25/2016   Zoster, Live 05/11/2006   Health Maintenance Due  Topic Date Due   Medicare Annual Wellness (AWV)  04/08/2023   Colonoscopy  05/10/2023    Last Pap smear:08/16/2018 Last mammogram:03/22/2023 Last colonoscopy:05/09/2018 Last DEXA:08/26/2022 Dentist:4 times year Ophtho:yearly Exercise: regularZZ '    Other doctors caring for patient include: Optometry: Dr. Leveda Anna Dentist:Dr. Lysbeth Penner Periodontist: Dr. Dulcy Fanny GI:Dr. Marina Goodell Card:[facility] Ortho: Dr. Carola Frost  Advanced directives:yes  Does  Patient Have a Medical Advance Directive?: Yes Type of Advance Directive: Healthcare Power of Attorney, Living will, Out of facility DNR (pink MOST or yellow form) Does patient want to make changes to medical advance directive?: No - Patient declined Copy of Healthcare Power of Attorney in Chart?: Yes - validated most recent copy scanned in chart (See row information)  Depression screen:  See questionnaire below.     04/20/2023    9:26 AM 04/07/2022    9:41 AM 03/31/2021    9:31 AM 03/21/2020    9:35 AM 03/20/2019    9:41 AM  Depression screen PHQ 2/9  Decreased Interest 0 0 0 0 0  Down, Depressed, Hopeless 0 0 0 0 0  PHQ - 2 Score 0 0 0 0 0    Fall Risk Screen: see questionnaire below.    04/20/2023    9:26 AM 04/07/2022    9:41 AM 03/31/2021    9:31 AM 03/21/2020    9:34 AM 03/20/2019    9:40 AM  Fall Risk   Falls in the past year? 0 0 0 1 0  Number falls in past yr: 0 0 0 1   Injury with Fall? 0 0 0 1   Risk for fall due to : No Fall Risks No Fall Risks No Fall Risks    Follow up Falls evaluation completed Falls evaluation completed Falls evaluation completed      ADL screen:  See questionnaire below Functional Status Survey: Is the patient deaf or have difficulty hearing?: No Does the patient have difficulty seeing, even when wearing glasses/contacts?: Yes (using reading glasses to kniit) Does the patient have difficulty concentrating, remembering, or making decisions?: No Does the patient have difficulty walking or climbing stairs?: No Does the patient have difficulty dressing or bathing?: No Does the patient have  difficulty doing errands alone such as visiting a doctor's office or shopping?: No   Review of Systems Constitutional: -, -unexpected weight change, -anorexia, -fatigue Allergy: -sneezing, -itching, -congestion Dermatology: denies changing moles, rash, lumps ENT: -runny nose, -ear pain, -sore throat,  Cardiology:  -chest pain, -palpitations,  -orthopnea, Respiratory: -cough, -shortness of breath, -dyspnea on exertion, -wheezing,  Gastroenterology: -abdominal pain, -nausea, -vomiting, -diarrhea, -constipation, -dysphagia Hematology: -bleeding or bruising problems Musculoskeletal: -arthralgias, -myalgias, -joint swelling, -back pain, - Ophthalmology: -vision changes,  Urology: -dysuria, -difficulty urinating,  -urinary frequency, -urgency, incontinence Neurology: -, -numbness, , -memory loss, -falls, -dizziness    PHYSICAL EXAM:  BP (!) 160/80   Pulse 68   Ht 5\' 7"  (1.702 m)   Wt 159 lb 3.2 oz (72.2 kg)   LMP 03/23/1994 (Approximate)   BMI 24.93 kg/m   General Appearance: Alert, cooperative, no distress, appears stated age Head: Normocephalic, without obvious abnormality, atraumatic Eyes: PERRL, conjunctiva/corneas clear, EOM's intact,  Ears: Normal TM's and external ear canals Nose: Nares normal, mucosa normal, no drainage or sinus tenderness Throat: Lips, mucosa, and tongue normal; teeth and gums normal Neck: Supple, no lymphadenopathy;  thyroid:  no enlargement/tenderness/nodules; no carotid bruit or JVD Lungs: Clear to auscultation bilaterally without wheezes, rales or ronchi; respirations unlabored Heart: Regular rate and rhythm, S1 and S2 normal, systolic murmur heard.   Skin:  Skin color, texture, turgor normal, no rashes or lesions Lymph nodes: Cervical, supraclavicular, and axillary nodes normal Neurologic:  CNII-XII intact, normal strength, sensation and gait; reflexes 2+ and symmetric throughout Psych: Normal mood, affect, hygiene and grooming.  ASSESSMENT/PLAN: Routine general medical examination at a health care facility  Seasonal allergic rhinitis due to pollen  Essential hypertension - Plan: CBC with Differential/Platelet, Comprehensive metabolic panel, chlorthalidone (HYGROTON) 25 MG tablet, nebivolol (BYSTOLIC) 10 MG tablet  Hyperlipidemia with target LDL less than 100 - Plan: Lipid panel,  atorvastatin (LIPITOR) 80 MG tablet  Other specified hypothyroidism - Plan: TSH  Nonrheumatic aortic valve insufficiency  Nonrheumatic mitral valve regurgitation  Osteopenia, unspecified location  Mixed hyperlipidemia - Plan: ezetimibe (ZETIA) 10 MG tablet  Hypothyroidism, unspecified type - Plan: levothyroxine (SYNTHROID) 100 MCG tablet    Immunization recommendations discussed.  Colonoscopy recommendations reviewed.  I decided to use FIT testing on her and you have a colonoscopy at her age.  She was comfortable with that.  Recommend she have the pharmacy send all of their immunization record.  She apparently did have RSV.   Medicare Attestation I have personally reviewed: The patient's medical and social history Their use of alcohol, tobacco or illicit drugs Their current medications and supplements The patient's functional ability including ADLs,fall risks, home safety risks, cognitive, and hearing and visual impairment Diet and physical activities Evidence for depression or mood disorders  The patient's weight, height, and BMI have been recorded in the chart.  I have made referrals, counseling, and provided education to the patient based on review of the above and I have provided the patient with a written personalized care plan for preventive services.     Sharlot Gowda, MD   04/20/2023

## 2023-04-20 NOTE — Addendum Note (Signed)
Addended by: Debbrah Alar F on: 04/20/2023 03:25 PM   Modules accepted: Orders

## 2023-04-21 ENCOUNTER — Encounter: Payer: Self-pay | Admitting: Family Medicine

## 2023-04-21 LAB — CBC WITH DIFFERENTIAL/PLATELET
Basophils Absolute: 0.1 10*3/uL (ref 0.0–0.2)
Basos: 1 %
EOS (ABSOLUTE): 0.1 10*3/uL (ref 0.0–0.4)
Eos: 2 %
Hematocrit: 45.9 % (ref 34.0–46.6)
Hemoglobin: 15.7 g/dL (ref 11.1–15.9)
Immature Grans (Abs): 0 10*3/uL (ref 0.0–0.1)
Immature Granulocytes: 1 %
Lymphocytes Absolute: 1.4 10*3/uL (ref 0.7–3.1)
Lymphs: 18 %
MCH: 31.5 pg (ref 26.6–33.0)
MCHC: 34.2 g/dL (ref 31.5–35.7)
MCV: 92 fL (ref 79–97)
Monocytes Absolute: 1 10*3/uL — ABNORMAL HIGH (ref 0.1–0.9)
Monocytes: 12 %
Neutrophils Absolute: 5.3 10*3/uL (ref 1.4–7.0)
Neutrophils: 66 %
Platelets: 219 10*3/uL (ref 150–450)
RBC: 4.99 x10E6/uL (ref 3.77–5.28)
RDW: 12.8 % (ref 11.7–15.4)
WBC: 7.9 10*3/uL (ref 3.4–10.8)

## 2023-04-21 LAB — COMPREHENSIVE METABOLIC PANEL
ALT: 24 IU/L (ref 0–32)
AST: 23 IU/L (ref 0–40)
Albumin: 4.4 g/dL (ref 3.8–4.8)
Alkaline Phosphatase: 91 IU/L (ref 44–121)
BUN/Creatinine Ratio: 19 (ref 12–28)
BUN: 22 mg/dL (ref 8–27)
Bilirubin Total: 1.1 mg/dL (ref 0.0–1.2)
CO2: 24 mmol/L (ref 20–29)
Calcium: 10.1 mg/dL (ref 8.7–10.3)
Chloride: 104 mmol/L (ref 96–106)
Creatinine, Ser: 1.13 mg/dL — ABNORMAL HIGH (ref 0.57–1.00)
Globulin, Total: 2.7 g/dL (ref 1.5–4.5)
Glucose: 107 mg/dL — ABNORMAL HIGH (ref 70–99)
Potassium: 4.3 mmol/L (ref 3.5–5.2)
Sodium: 144 mmol/L (ref 134–144)
Total Protein: 7.1 g/dL (ref 6.0–8.5)
eGFR: 49 mL/min/{1.73_m2} — ABNORMAL LOW (ref >59)

## 2023-04-21 LAB — LIPID PANEL
Cholesterol, Total: 175 mg/dL (ref 100–199)
HDL: 62 mg/dL (ref >39)
LDL CALC COMMENT:: 2.8 ratio (ref 0.0–4.4)
LDL Chol Calc (NIH): 94 mg/dL (ref 0–99)
Triglycerides: 107 mg/dL (ref 0–149)
VLDL Cholesterol Cal: 19 mg/dL (ref 5–40)

## 2023-04-21 LAB — TSH: TSH: 1.84 u[IU]/mL (ref 0.450–4.500)

## 2023-05-18 ENCOUNTER — Encounter: Payer: Self-pay | Admitting: Internal Medicine

## 2023-06-04 ENCOUNTER — Other Ambulatory Visit: Payer: Self-pay | Admitting: Family Medicine

## 2023-06-04 DIAGNOSIS — E785 Hyperlipidemia, unspecified: Secondary | ICD-10-CM

## 2023-06-04 DIAGNOSIS — I10 Essential (primary) hypertension: Secondary | ICD-10-CM

## 2023-06-04 DIAGNOSIS — E782 Mixed hyperlipidemia: Secondary | ICD-10-CM

## 2023-06-15 DIAGNOSIS — Z0000 Encounter for general adult medical examination without abnormal findings: Secondary | ICD-10-CM | POA: Diagnosis not present

## 2023-06-23 ENCOUNTER — Encounter: Payer: Self-pay | Admitting: Internal Medicine

## 2023-06-23 LAB — FECAL OCCULT BLOOD, IMMUNOCHEMICAL: Fecal Occult Bld: NEGATIVE

## 2023-07-05 ENCOUNTER — Other Ambulatory Visit: Payer: Self-pay

## 2023-07-05 ENCOUNTER — Emergency Department (HOSPITAL_COMMUNITY)
Admission: EM | Admit: 2023-07-05 | Discharge: 2023-07-06 | Discharge status: Against Medical Advice | Attending: Emergency Medicine | Admitting: Emergency Medicine

## 2023-07-05 DIAGNOSIS — H5789 Other specified disorders of eye and adnexa: Secondary | ICD-10-CM | POA: Insufficient documentation

## 2023-07-05 DIAGNOSIS — Z5321 Procedure and treatment not carried out due to patient leaving prior to being seen by health care provider: Secondary | ICD-10-CM | POA: Insufficient documentation

## 2023-07-05 NOTE — ED Triage Notes (Signed)
 Pt arrived reporting yesterday had a 3-5 min episode of tunnel vision. Reports a few minutes later she was able to see clear again. Hx of cataracts. Called her eye doc and was advised to come in  Denies headache, any vision changes at this time or any other symptoms.

## 2023-07-05 NOTE — ED Notes (Signed)
Pt decided to leave at this time.

## 2023-07-06 DIAGNOSIS — H547 Unspecified visual loss: Secondary | ICD-10-CM | POA: Diagnosis not present

## 2023-07-06 DIAGNOSIS — H539 Unspecified visual disturbance: Secondary | ICD-10-CM | POA: Diagnosis not present

## 2023-07-28 DIAGNOSIS — S0990XA Unspecified injury of head, initial encounter: Secondary | ICD-10-CM | POA: Diagnosis not present

## 2023-07-28 DIAGNOSIS — S27322A Contusion of lung, bilateral, initial encounter: Secondary | ICD-10-CM | POA: Diagnosis not present

## 2023-07-28 DIAGNOSIS — S79922A Unspecified injury of left thigh, initial encounter: Secondary | ICD-10-CM | POA: Diagnosis not present

## 2023-07-28 DIAGNOSIS — M79604 Pain in right leg: Secondary | ICD-10-CM | POA: Diagnosis not present

## 2023-07-28 DIAGNOSIS — Z9189 Other specified personal risk factors, not elsewhere classified: Secondary | ICD-10-CM | POA: Diagnosis not present

## 2023-07-28 DIAGNOSIS — E039 Hypothyroidism, unspecified: Secondary | ICD-10-CM | POA: Diagnosis not present

## 2023-07-28 DIAGNOSIS — S27321A Contusion of lung, unilateral, initial encounter: Secondary | ICD-10-CM | POA: Diagnosis not present

## 2023-07-28 DIAGNOSIS — S199XXA Unspecified injury of neck, initial encounter: Secondary | ICD-10-CM | POA: Diagnosis not present

## 2023-07-28 DIAGNOSIS — Z0189 Encounter for other specified special examinations: Secondary | ICD-10-CM | POA: Diagnosis not present

## 2023-07-28 DIAGNOSIS — R9431 Abnormal electrocardiogram [ECG] [EKG]: Secondary | ICD-10-CM | POA: Diagnosis not present

## 2023-07-28 DIAGNOSIS — S2220XA Unspecified fracture of sternum, initial encounter for closed fracture: Secondary | ICD-10-CM | POA: Diagnosis not present

## 2023-07-28 DIAGNOSIS — S8991XA Unspecified injury of right lower leg, initial encounter: Secondary | ICD-10-CM | POA: Diagnosis not present

## 2023-07-28 DIAGNOSIS — E041 Nontoxic single thyroid nodule: Secondary | ICD-10-CM | POA: Diagnosis not present

## 2023-07-28 DIAGNOSIS — T1490XA Injury, unspecified, initial encounter: Secondary | ICD-10-CM | POA: Diagnosis not present

## 2023-07-28 DIAGNOSIS — G8911 Acute pain due to trauma: Secondary | ICD-10-CM | POA: Diagnosis not present

## 2023-07-28 DIAGNOSIS — S79921A Unspecified injury of right thigh, initial encounter: Secondary | ICD-10-CM | POA: Diagnosis not present

## 2023-07-28 DIAGNOSIS — S3991XA Unspecified injury of abdomen, initial encounter: Secondary | ICD-10-CM | POA: Diagnosis not present

## 2023-07-28 DIAGNOSIS — F419 Anxiety disorder, unspecified: Secondary | ICD-10-CM | POA: Diagnosis not present

## 2023-07-28 DIAGNOSIS — M25561 Pain in right knee: Secondary | ICD-10-CM | POA: Diagnosis not present

## 2023-07-28 DIAGNOSIS — S3992XA Unspecified injury of lower back, initial encounter: Secondary | ICD-10-CM | POA: Diagnosis not present

## 2023-07-28 DIAGNOSIS — M79651 Pain in right thigh: Secondary | ICD-10-CM | POA: Diagnosis not present

## 2023-07-28 DIAGNOSIS — E785 Hyperlipidemia, unspecified: Secondary | ICD-10-CM | POA: Diagnosis not present

## 2023-07-28 DIAGNOSIS — I352 Nonrheumatic aortic (valve) stenosis with insufficiency: Secondary | ICD-10-CM | POA: Diagnosis not present

## 2023-07-28 DIAGNOSIS — S0083XA Contusion of other part of head, initial encounter: Secondary | ICD-10-CM | POA: Diagnosis not present

## 2023-07-28 DIAGNOSIS — M25562 Pain in left knee: Secondary | ICD-10-CM | POA: Diagnosis not present

## 2023-07-28 DIAGNOSIS — Z79899 Other long term (current) drug therapy: Secondary | ICD-10-CM | POA: Diagnosis not present

## 2023-07-28 DIAGNOSIS — Z789 Other specified health status: Secondary | ICD-10-CM | POA: Diagnosis not present

## 2023-07-28 DIAGNOSIS — Z7409 Other reduced mobility: Secondary | ICD-10-CM | POA: Diagnosis not present

## 2023-07-28 DIAGNOSIS — Z7189 Other specified counseling: Secondary | ICD-10-CM | POA: Diagnosis not present

## 2023-07-28 DIAGNOSIS — I10 Essential (primary) hypertension: Secondary | ICD-10-CM | POA: Diagnosis not present

## 2023-07-28 DIAGNOSIS — S8992XA Unspecified injury of left lower leg, initial encounter: Secondary | ICD-10-CM | POA: Diagnosis not present

## 2023-07-28 DIAGNOSIS — M79652 Pain in left thigh: Secondary | ICD-10-CM | POA: Diagnosis not present

## 2023-07-28 DIAGNOSIS — S2222XA Fracture of body of sternum, initial encounter for closed fracture: Secondary | ICD-10-CM | POA: Diagnosis not present

## 2023-07-28 DIAGNOSIS — S8001XA Contusion of right knee, initial encounter: Secondary | ICD-10-CM | POA: Diagnosis not present

## 2023-07-29 DIAGNOSIS — M25562 Pain in left knee: Secondary | ICD-10-CM | POA: Diagnosis not present

## 2023-07-29 DIAGNOSIS — M79604 Pain in right leg: Secondary | ICD-10-CM | POA: Diagnosis not present

## 2023-07-29 DIAGNOSIS — M79652 Pain in left thigh: Secondary | ICD-10-CM | POA: Diagnosis not present

## 2023-07-29 DIAGNOSIS — M79651 Pain in right thigh: Secondary | ICD-10-CM | POA: Diagnosis not present

## 2023-07-29 DIAGNOSIS — S79921A Unspecified injury of right thigh, initial encounter: Secondary | ICD-10-CM | POA: Diagnosis not present

## 2023-07-29 DIAGNOSIS — R9431 Abnormal electrocardiogram [ECG] [EKG]: Secondary | ICD-10-CM | POA: Diagnosis not present

## 2023-07-29 DIAGNOSIS — M25561 Pain in right knee: Secondary | ICD-10-CM | POA: Diagnosis not present

## 2023-07-29 DIAGNOSIS — S8991XA Unspecified injury of right lower leg, initial encounter: Secondary | ICD-10-CM | POA: Diagnosis not present

## 2023-07-29 DIAGNOSIS — S79922A Unspecified injury of left thigh, initial encounter: Secondary | ICD-10-CM | POA: Diagnosis not present

## 2023-07-29 DIAGNOSIS — S27322A Contusion of lung, bilateral, initial encounter: Secondary | ICD-10-CM | POA: Diagnosis not present

## 2023-07-29 DIAGNOSIS — T1490XA Injury, unspecified, initial encounter: Secondary | ICD-10-CM | POA: Diagnosis not present

## 2023-07-29 DIAGNOSIS — S8992XA Unspecified injury of left lower leg, initial encounter: Secondary | ICD-10-CM | POA: Diagnosis not present

## 2023-07-29 DIAGNOSIS — I352 Nonrheumatic aortic (valve) stenosis with insufficiency: Secondary | ICD-10-CM | POA: Diagnosis not present

## 2023-07-29 DIAGNOSIS — G8911 Acute pain due to trauma: Secondary | ICD-10-CM | POA: Diagnosis not present

## 2023-07-30 DIAGNOSIS — S2220XA Unspecified fracture of sternum, initial encounter for closed fracture: Secondary | ICD-10-CM | POA: Diagnosis not present

## 2023-07-30 DIAGNOSIS — T1490XA Injury, unspecified, initial encounter: Secondary | ICD-10-CM | POA: Diagnosis not present

## 2023-08-10 DIAGNOSIS — G453 Amaurosis fugax: Secondary | ICD-10-CM | POA: Diagnosis not present

## 2023-08-10 DIAGNOSIS — H547 Unspecified visual loss: Secondary | ICD-10-CM | POA: Diagnosis not present

## 2023-08-11 ENCOUNTER — Ambulatory Visit: Admitting: Family Medicine

## 2023-08-11 VITALS — BP 148/86 | HR 68 | Wt 163.2 lb

## 2023-08-11 DIAGNOSIS — S8001XA Contusion of right knee, initial encounter: Secondary | ICD-10-CM

## 2023-08-11 DIAGNOSIS — H53482 Generalized contraction of visual field, left eye: Secondary | ICD-10-CM

## 2023-08-11 DIAGNOSIS — S2220XA Unspecified fracture of sternum, initial encounter for closed fracture: Secondary | ICD-10-CM | POA: Diagnosis not present
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
## 2023-08-11 NOTE — Patient Instructions (Signed)
 Heat for 20 minutes 3 times per day to your knee and Tylenol  as needed for aches and pains

## 2023-08-29 NOTE — Progress Notes (Unsigned)
 Cardiology Office Note    Date:  08/29/2023  ID:  [name], DOB [birth year], MRN [id number] PCP:  Watson Hacking, MD  Cardiologist:  Hazle Lites, MD  Electrophysiologist:  None   Chief Complaint: ***  History of Present Illness: .    MARRIANNE Owen is a 78 y.o. female with visit-pertinent history of lung cancer s/p radiation and chemotherapy, hypothyroidism, mild MV regurgitation, pericardial effusion with pericardial window in 2010, dyslipidemia, aortic valve sclerosis and hypertension.  Her last echo in 08/2021 showed EF 55 to 60%, left ventricle grade II diastolic dysfunction, mild MV regurgitation, mild aortic valve sclerosis. She was seen 5/23 for annual follow up with Dr. Maximo Spar, she was stable from a cardiac standpoint.   Patient was last seen in clinic on 08/28/2022, she remained stable from a cardiac standpoint.  She did note some increased muscle fatigue in her legs, ABIs were discussed however patient deferred at that time.  Today she presents for follow up. She reports that she   Mitral valve regurgitation/aortic valve sclerosis class cardiac murmur: Last echocardiogram on 08/27/2021 indicated mild mitral valve regurgitation and aortic valve sclerosis with mild to moderate regurgitation, her EF was 55 to 60%, left ventricular grade 2 diastolic dysfunction noted.   Today she reports Hypertension: Blood pressure today Hyperlipidemia: Last lipid panel in Lower extremity muscle fatigue:   Labwork independently reviewed:   ROS: .   *** denies chest pain, shortness of breath, lower extremity edema, fatigue, palpitations, melena, hematuria, hemoptysis, diaphoresis, weakness, presyncope, syncope, orthopnea, and PND.  All other systems are reviewed and otherwise negative.  Studies Reviewed: Aaron Aas    EKG:  EKG is ordered today, personally reviewed, demonstrating ***     CV Studies: Cardiac studies reviewed are outlined and summarized above. Otherwise please see EMR for full  report. Cardiac Studies & Procedures   ______________________________________________________________________________________________     ECHOCARDIOGRAM  ECHOCARDIOGRAM COMPLETE 08/27/2021  Narrative ECHOCARDIOGRAM REPORT    Patient Name:   Tina Owen Date of Exam: 08/27/2021 Medical Rec #:  [id number]       Height:       67.0 in Accession #:    [id number]      Weight:       160.0 lb Date of Birth:  12-20-1945       BSA:          1.839 m Patient Age:    76 years        BP:           130/80 mmHg Patient Gender: F               HR:           72 bpm. Exam Location:  Outpatient  Procedure: 2D Echo, Cardiac Doppler, Color Doppler and Strain Analysis  Indications:    R06.9 DOE; R01.1 Murmur  History:        Patient has prior history of Echocardiogram examinations, most recent 03/16/2019. Signs/Symptoms:Dyspnea and Murmur; Risk Factors:Hypertension, Dyslipidemia and Former Smoker. Patient denies chest pain and leg edema. She does have some DOE due to partial lung removal due to lung cancer 20+ years ago. History of radiation and chemotherapy.  Sonographer:    Richarda Chance RVT, RDCS (AE), RDMS Referring Phys: 25 KENNETH C HILTY  IMPRESSIONS   1. Left ventricular ejection fraction, by estimation, is 55 to 60%. The left ventricle has normal function. The left ventricle has no regional wall motion abnormalities. Left ventricular  diastolic parameters are consistent with Grade II diastolic dysfunction (pseudonormalization). Elevated left atrial pressure. The average left ventricular global longitudinal strain is -12.3 %. 2. Right ventricular systolic function is normal. The right ventricular size is normal. There is mildly elevated pulmonary artery systolic pressure. The estimated right ventricular systolic pressure is 36.4 mmHg. 3. Left atrial size was mildly dilated. 4. Right atrial size was mildly dilated. 5. The mitral valve is normal in structure. Mild mitral valve  regurgitation. No evidence of mitral stenosis. 6. The aortic valve is tricuspid. There is mild calcification of the aortic valve. There is moderate thickening of the aortic valve. Aortic valve regurgitation is mild to moderate. Aortic valve sclerosis/calcification is present, without any evidence of aortic stenosis. 7. The inferior vena cava is normal in size with greater than 50% respiratory variability, suggesting right atrial pressure of 3 mmHg.  Comparison(s): Prior images reviewed side by side. The left ventricular diastolic function is significantly worse.  FINDINGS Left Ventricle: Left ventricular ejection fraction, by estimation, is 55 to 60%. The left ventricle has normal function. The left ventricle has no regional wall motion abnormalities. The average left ventricular global longitudinal strain is -12.3 %. The left ventricular internal cavity size was normal in size. There is no left ventricular hypertrophy. Left ventricular diastolic parameters are consistent with Grade II diastolic dysfunction (pseudonormalization). Elevated left atrial pressure.  Right Ventricle: The right ventricular size is normal. No increase in right ventricular wall thickness. Right ventricular systolic function is normal. There is mildly elevated pulmonary artery systolic pressure. The tricuspid regurgitant velocity is 2.89 m/s, and with an assumed right atrial pressure of 3 mmHg, the estimated right ventricular systolic pressure is 36.4 mmHg.  Left Atrium: Left atrial size was mildly dilated.  Right Atrium: Right atrial size was mildly dilated.  Pericardium: There is no evidence of pericardial effusion.  Mitral Valve: The mitral valve is normal in structure. Mild mitral annular calcification. Mild mitral valve regurgitation, with centrally-directed jet. No evidence of mitral valve stenosis.  Tricuspid Valve: The tricuspid valve is normal in structure. Tricuspid valve regurgitation is trivial.  Aortic  Valve: The aortic valve is tricuspid. There is mild calcification of the aortic valve. There is moderate thickening of the aortic valve. Aortic valve regurgitation is mild to moderate. Aortic regurgitation PHT measures 342 msec. Aortic valve sclerosis/calcification is present, without any evidence of aortic stenosis. Aortic valve mean gradient measures 6.0 mmHg. Aortic valve peak gradient measures 12.0 mmHg. Aortic valve area, by VTI measures 1.48 cm.  Pulmonic Valve: The pulmonic valve was normal in structure. Pulmonic valve regurgitation is mild.  Aorta: The aortic root and ascending aorta are structurally normal, with no evidence of dilitation.  Venous: The inferior vena cava is normal in size with greater than 50% respiratory variability, suggesting right atrial pressure of 3 mmHg.  IAS/Shunts: No atrial level shunt detected by color flow Doppler.   LEFT VENTRICLE PLAX 2D LVIDd:         4.65 cm     Diastology LVIDs:         3.31 cm     LV e' medial:    5.08 cm/s LV PW:         0.93 cm     LV E/e' medial:  24.6 LV IVS:        0.94 cm     LV e' lateral:   8.38 cm/s LVOT diam:     2.10 cm     LV E/e' lateral: 14.9  LV SV:         61 LV SV Index:   33          2D Longitudinal Strain LVOT Area:     3.46 cm    2D Strain GLS Avg:     -12.3 %  LV Volumes (MOD) LV vol d, MOD A2C: 86.7 ml LV vol d, MOD A4C: 71.7 ml LV vol s, MOD A2C: 38.0 ml LV vol s, MOD A4C: 29.9 ml LV SV MOD A2C:     48.7 ml LV SV MOD A4C:     71.7 ml LV SV MOD BP:      46.2 ml  RIGHT VENTRICLE RV S prime:     14.40 cm/s TAPSE (M-mode): 2.1 cm  LEFT ATRIUM             Index        RIGHT ATRIUM           Index LA diam:        4.00 cm 2.17 cm/m   RA Area:     19.20 cm LA Vol (A2C):   76.8 ml 41.76 ml/m  RA Volume:   58.30 ml  31.70 ml/m LA Vol (A4C):   70.0 ml 38.06 ml/m LA Biplane Vol: 74.2 ml 40.34 ml/m AORTIC VALVE                     PULMONIC VALVE AV Area (Vmax):    1.59 cm      PV Vmax:           1.77 m/s AV Area (Vmean):   1.44 cm      PV Vmean:         118.000 cm/s AV Area (VTI):     1.48 cm      PV VTI:           0.414 m AV Vmax:           173.00 cm/s   PV Peak grad:     12.5 mmHg AV Vmean:          118.000 cm/s  PV Mean grad:     7.0 mmHg AV VTI:            0.412 m       PR End Diast Vel: 4.33 msec AV Peak Grad:      12.0 mmHg AV Mean Grad:      6.0 mmHg LVOT Vmax:         79.50 cm/s LVOT Vmean:        49.100 cm/s LVOT VTI:          0.176 m LVOT/AV VTI ratio: 0.43 AI PHT:            342 msec AR Vena Contracta: 0.68 cm  AORTA Ao Root diam: 3.40 cm Ao Asc diam:  3.50 cm Ao Arch diam: 2.7 cm  MITRAL VALVE                  TRICUSPID VALVE MV Area (PHT): 4.86 cm       TR Peak grad:   33.4 mmHg MV Decel Time: 156 msec       TR Vmax:        289.00 cm/s MR Peak grad:    107.7 mmHg MR Mean grad:    79.0 mmHg    SHUNTS MR Vmax:         519.00 cm/s  Systemic VTI:  0.18 m MR  Vmean:        422.0 cm/s   Systemic Diam: 2.10 cm MR PISA:         2.26 cm MR PISA Eff ROA: 10 mm MR PISA Radius:  0.60 cm MV E velocity: 125.00 cm/s MV A velocity: 52.80 cm/s MV E/A ratio:  2.37  Mihai Croitoru MD Electronically signed by Luana Rumple MD Signature Date/Time: 08/27/2021/3:25:42 PM    Final          ______________________________________________________________________________________________       Current Reported Medications:.    No outpatient medications have been marked as taking for the 08/31/23 encounter (Appointment) with Briggs Edelen D, NP.    Physical Exam:    VS:  LMP 03/23/1994 (Approximate)    Wt Readings from Last 3 Encounters:  08/11/23 163 lb 3.2 oz (74 kg)  07/05/23 159 lb 2.8 oz (72.2 kg)  04/20/23 159 lb 3.2 oz (72.2 kg)    GEN: Well nourished, well developed in no acute distress NECK: No JVD; No carotid bruits CARDIAC: ***RRR, no murmurs, rubs, gallops RESPIRATORY:  Clear to auscultation without rales, wheezing or rhonchi  ABDOMEN: Soft,  non-tender, non-distended EXTREMITIES:  No edema; No acute deformity     Asessement and Plan:.     ***     Disposition: F/u with ***  Signed, Aileana Hodder D Capers Hagmann, NP

## 2023-08-31 ENCOUNTER — Ambulatory Visit: Attending: Cardiology | Admitting: Cardiology

## 2023-08-31 ENCOUNTER — Ambulatory Visit: Attending: Cardiology

## 2023-08-31 ENCOUNTER — Encounter: Payer: Self-pay | Admitting: Cardiology

## 2023-08-31 VITALS — BP 134/76 | HR 69 | Ht 67.0 in | Wt 163.2 lb

## 2023-08-31 DIAGNOSIS — R011 Cardiac murmur, unspecified: Secondary | ICD-10-CM

## 2023-08-31 DIAGNOSIS — I340 Nonrheumatic mitral (valve) insufficiency: Secondary | ICD-10-CM | POA: Diagnosis not present

## 2023-08-31 DIAGNOSIS — E785 Hyperlipidemia, unspecified: Secondary | ICD-10-CM

## 2023-08-31 DIAGNOSIS — R002 Palpitations: Secondary | ICD-10-CM

## 2023-08-31 DIAGNOSIS — I10 Essential (primary) hypertension: Secondary | ICD-10-CM | POA: Diagnosis not present

## 2023-08-31 DIAGNOSIS — I351 Nonrheumatic aortic (valve) insufficiency: Secondary | ICD-10-CM

## 2023-08-31 NOTE — Patient Instructions (Signed)
 Medication Instructions:  No changes *If you need a refill on your cardiac medications before your next appointment, please call your pharmacy*  Lab Work: No labs  Testing/Procedures: Your physician has requested that you have an echocardiogram. Echocardiography is a painless test that uses sound waves to create images of your heart. It provides your doctor with information about the size and shape of your heart and how well your heart's chambers and valves are working. This procedure takes approximately one hour. There are no restrictions for this procedure. Please do NOT wear cologne, perfume, aftershave, or lotions (deodorant is allowed). Please arrive 15 minutes prior to your appointment time.  Please note: We ask at that you not bring children with you during ultrasound (echo/ vascular) testing. Due to room size and safety concerns, children are not allowed in the ultrasound rooms during exams. Our front office staff cannot provide observation of children in our lobby area while testing is being conducted. An adult accompanying a patient to their appointment will only be allowed in the ultrasound room at the discretion of the ultrasound technician under special circumstances. We apologize for any inconvenience.  Follow-Up: At [facility], you and your health needs are our priority.  As part of our continuing mission to provide you with exceptional heart care, our providers are all part of one team.  This team includes your primary Cardiologist (physician) and Advanced Practice Providers or APPs (Physician Assistants and Nurse Practitioners) who all work together to provide you with the care you need, when you need it.  Your next appointment:   3 month(s)  Provider:   Hazle Lites, MD or Katlyn West, NP  We recommend signing up for the patient portal called "MyChart".  Sign up information is provided on this After Visit Summary.  MyChart is used to connect with patients for  Virtual Visits (Telemedicine).  Patients are able to view lab/test results, encounter notes, upcoming appointments, etc.  Non-urgent messages can be sent to your provider as well.   To learn more about what you can do with MyChart, go to [email or web address].   Other Instructions ZIO XT- Long Term Monitor Instructions  Your physician has requested you wear a ZIO patch monitor for 14 days.  This is a single patch monitor. [employer] supplies one patch monitor per enrollment. Additional stickers are not available. Please do not apply patch if you will be having a Nuclear Stress Test,  Echocardiogram, Cardiac CT, MRI, or Chest Xray during the period you would be wearing the  monitor. The patch cannot be worn during these tests. You cannot remove and re-apply the  ZIO XT patch monitor.  Your ZIO patch monitor will be mailed 3 day USPS to your address on file. It may take 3-5 days  to receive your monitor after you have been enrolled.  Once you have received your monitor, please review the enclosed instructions. Your monitor  has already been registered assigning a specific monitor serial # to you.  Billing and Patient Assistance Program Information  We have supplied [employer] with any of your insurance information on file for billing purposes. [employer] offers a sliding scale Patient Assistance Program for patients that do not have  insurance, or whose insurance does not completely cover the cost of the ZIO monitor.  You must apply for the Patient Assistance Program to qualify for this discounted rate.  To apply, please call [employer] at [phone], select option 4, select option 2, ask to apply for  Patient Assistance  Program. Sanna Crystal will ask your household income, and how many people  are in your household. They will quote your out-of-pocket cost based on that information.  [employer] will also be able to set up a 8-month, interest-free payment plan if needed.  Applying the monitor   Shave  hair from upper left chest.  Hold abrader disc by orange tab. Rub abrader in 40 strokes over the upper left chest as  indicated in your monitor instructions.  Clean area with 4 enclosed alcohol pads. Let dry.  Apply patch as indicated in monitor instructions. Patch will be placed under collarbone on left  side of chest with arrow pointing upward.  Rub patch adhesive wings for 2 minutes. Remove white label marked "1". Remove the white  label marked "2". Rub patch adhesive wings for 2 additional minutes.  While looking in a mirror, press and release button in center of patch. A small green light will  flash 3-4 times. This will be your only indicator that the monitor has been turned on.  Do not shower for the first 24 hours. You may shower after the first 24 hours.  Press the button if you feel a symptom. You will hear a small click. Record Date, Time and  Symptom in the Patient Logbook.  When you are ready to remove the patch, follow instructions on the last 2 pages of Patient  Logbook. Stick patch monitor onto the last page of Patient Logbook.  Place Patient Logbook in the blue and white box. Use locking tab on box and tape box closed  securely. The blue and white box has prepaid postage on it. Please place it in the mailbox as  soon as possible. Your physician should have your test results approximately 7 days after the  monitor has been mailed back to [facility].  Call [facility] Customer Care at [phone] if you have questions regarding  your ZIO XT patch monitor. Call them immediately if you see an orange light blinking on your  monitor.  If your monitor falls off in less than 4 days, contact our Monitor department at [phone].  If your monitor becomes loose or falls off after 4 days call [employer] at [phone] for  suggestions on securing your monitor

## 2023-08-31 NOTE — Progress Notes (Unsigned)
 Enrolled for [employer] to mail a ZIO XT long term holter monitor to the patients address on file.   Dr. Rennis Golden to read.

## 2023-09-08 DIAGNOSIS — G453 Amaurosis fugax: Secondary | ICD-10-CM | POA: Diagnosis not present

## 2023-09-08 DIAGNOSIS — R930 Abnormal findings on diagnostic imaging of skull and head, not elsewhere classified: Secondary | ICD-10-CM | POA: Diagnosis not present

## 2023-09-08 DIAGNOSIS — J90 Pleural effusion, not elsewhere classified: Secondary | ICD-10-CM | POA: Diagnosis not present

## 2023-09-08 DIAGNOSIS — I6782 Cerebral ischemia: Secondary | ICD-10-CM | POA: Diagnosis not present

## 2023-09-23 DIAGNOSIS — R002 Palpitations: Secondary | ICD-10-CM | POA: Diagnosis not present

## 2023-09-27 DIAGNOSIS — R002 Palpitations: Secondary | ICD-10-CM | POA: Diagnosis not present

## 2023-09-29 ENCOUNTER — Ambulatory Visit: Payer: Self-pay | Admitting: Cardiology

## 2023-09-29 DIAGNOSIS — H40013 Open angle with borderline findings, low risk, bilateral: Secondary | ICD-10-CM | POA: Diagnosis not present

## 2023-10-06 NOTE — Telephone Encounter (Signed)
 Called patient advised of below they verbalized understanding.

## 2023-10-06 NOTE — Telephone Encounter (Signed)
-----   Message from Katlyn D West sent at 09/29/2023  1:38 PM EDT ----- Please let Ms. Speak know that her cardiac monitor showed her predominant underlying rhythm was sinus rhythm, her average heart rate was 62 bpm. She had short episodes of fast beats originating from  the top chambers of the heart and single early beats from the top chambers of heart. There was no evidence of atrial fibrillation. Overall good results, continue current medications and follow up as  planned.  ----- Message ----- From: Mona Vinie BROCKS, MD Sent: 09/27/2023   3:11 PM EDT To: Katlyn D West, NP

## 2023-10-14 ENCOUNTER — Ambulatory Visit (HOSPITAL_COMMUNITY)
Admission: RE | Admit: 2023-10-14 | Discharge: 2023-10-14 | Disposition: A | Discharge status: Home / Self Care | Source: Ambulatory Visit | Attending: Cardiovascular Disease | Admitting: Cardiovascular Disease

## 2023-10-14 ENCOUNTER — Encounter (HOSPITAL_COMMUNITY): Payer: Self-pay | Admitting: Cardiology

## 2023-10-14 DIAGNOSIS — R002 Palpitations: Secondary | ICD-10-CM | POA: Diagnosis not present

## 2023-10-14 DIAGNOSIS — I340 Nonrheumatic mitral (valve) insufficiency: Secondary | ICD-10-CM | POA: Diagnosis not present

## 2023-10-14 LAB — ECHOCARDIOGRAM COMPLETE
AR max vel: 2.39 cm2
AV Area VTI: 2.24 cm2
AV Area mean vel: 2.19 cm2
AV Mean grad: 6 mmHg
AV Peak grad: 10.8 mmHg
Ao pk vel: 1.64 m/s
Area-P 1/2: 4.56 cm2
MV M vel: 5.71 m/s
MV Peak grad: 130.4 mmHg
MV VTI: 2.62 cm2
P 1/2 time: 332 ms
Radius: 0.6 cm
S' Lateral: 3 cm
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
## 2023-11-29 ENCOUNTER — Encounter: Payer: Self-pay | Admitting: Cardiology

## 2023-11-29 ENCOUNTER — Ambulatory Visit: Attending: Cardiology | Admitting: Cardiology

## 2023-11-29 VITALS — BP 138/72 | HR 71 | Ht 67.0 in | Wt 164.6 lb

## 2023-11-29 DIAGNOSIS — I340 Nonrheumatic mitral (valve) insufficiency: Secondary | ICD-10-CM

## 2023-11-29 DIAGNOSIS — I10 Essential (primary) hypertension: Secondary | ICD-10-CM | POA: Diagnosis not present

## 2023-11-29 DIAGNOSIS — E785 Hyperlipidemia, unspecified: Secondary | ICD-10-CM | POA: Diagnosis not present

## 2023-11-29 DIAGNOSIS — I351 Nonrheumatic aortic (valve) insufficiency: Secondary | ICD-10-CM | POA: Diagnosis not present

## 2023-11-29 DIAGNOSIS — R002 Palpitations: Secondary | ICD-10-CM

## 2023-11-29 NOTE — Patient Instructions (Signed)
 Medication Instructions:  Your physician recommends that you continue on your current medications as directed. Please refer to the Current Medication list given to you today.  *If you need a refill on your cardiac medications before your next appointment, please call your pharmacy*  Lab Work: NONE ordered at this time of appointment   Testing/Procedures: NONE ordered at this time of appointment   Follow-Up: At [facility], you and your health needs are our priority.  As part of our continuing mission to provide you with exceptional heart care, our providers are all part of one team.  This team includes your primary Cardiologist (physician) and Advanced Practice Providers or APPs (Physician Assistants and Nurse Practitioners) who all work together to provide you with the care you need, when you need it.  Your next appointment:   6 month(s)  Provider:   Vinie JAYSON Maxcy, MD or Katlyn West, NP          We recommend signing up for the patient portal called MyChart.  Sign up information is provided on this After Visit Summary.  MyChart is used to connect with patients for Virtual Visits (Telemedicine).  Patients are able to view lab/test results, encounter notes, upcoming appointments, etc.  Non-urgent messages can be sent to your provider as well.   To learn more about what you can do with MyChart, go to [email or web address].

## 2024-01-07 DIAGNOSIS — K5900 Constipation, unspecified: Secondary | ICD-10-CM | POA: Diagnosis not present

## 2024-01-07 DIAGNOSIS — J301 Allergic rhinitis due to pollen: Secondary | ICD-10-CM | POA: Diagnosis not present

## 2024-01-07 DIAGNOSIS — Z7982 Long term (current) use of aspirin: Secondary | ICD-10-CM | POA: Diagnosis not present

## 2024-01-07 DIAGNOSIS — I739 Peripheral vascular disease, unspecified: Secondary | ICD-10-CM | POA: Diagnosis not present

## 2024-01-07 DIAGNOSIS — R011 Cardiac murmur, unspecified: Secondary | ICD-10-CM | POA: Diagnosis not present

## 2024-01-07 DIAGNOSIS — N1831 Chronic kidney disease, stage 3a: Secondary | ICD-10-CM | POA: Diagnosis not present

## 2024-01-07 DIAGNOSIS — I129 Hypertensive chronic kidney disease with stage 1 through stage 4 chronic kidney disease, or unspecified chronic kidney disease: Secondary | ICD-10-CM | POA: Diagnosis not present

## 2024-01-07 DIAGNOSIS — E785 Hyperlipidemia, unspecified: Secondary | ICD-10-CM | POA: Diagnosis not present

## 2024-01-07 DIAGNOSIS — Z8249 Family history of ischemic heart disease and other diseases of the circulatory system: Secondary | ICD-10-CM | POA: Diagnosis not present

## 2024-02-25 ENCOUNTER — Other Ambulatory Visit: Payer: Self-pay | Admitting: Family Medicine

## 2024-02-25 ENCOUNTER — Other Ambulatory Visit: Payer: Self-pay

## 2024-02-25 DIAGNOSIS — E039 Hypothyroidism, unspecified: Secondary | ICD-10-CM

## 2024-02-25 MED ORDER — LEVOTHYROXINE SODIUM 100 MCG PO TABS: 100.0000 ug | ORAL_TABLET | Freq: Every day | ORAL | 0 refills | Status: DC

## 2024-03-27 LAB — HM MAMMOGRAPHY

## 2024-03-29 ENCOUNTER — Encounter: Payer: Self-pay | Admitting: Family Medicine

## 2024-05-25 ENCOUNTER — Ambulatory Visit: Payer: Medicare PPO | Admitting: Family Medicine

## 2024-05-29 ENCOUNTER — Ambulatory Visit: Admitting: Cardiology
